# Patient Record
Sex: Female | Born: 1940 | Race: White | Hispanic: No | Marital: Married | Smoking: Never smoker
Health system: Southern US, Community
[De-identification: ages and names within clinical notes are randomized; demographics above are authoritative.]

## PROBLEM LIST (undated history)

## (undated) DIAGNOSIS — I509 Heart failure, unspecified: Secondary | ICD-10-CM

## (undated) DIAGNOSIS — F0390 Unspecified dementia without behavioral disturbance: Secondary | ICD-10-CM

## (undated) DIAGNOSIS — E785 Hyperlipidemia, unspecified: Secondary | ICD-10-CM

## (undated) DIAGNOSIS — I639 Cerebral infarction, unspecified: Secondary | ICD-10-CM

## (undated) DIAGNOSIS — I2510 Atherosclerotic heart disease of native coronary artery without angina pectoris: Secondary | ICD-10-CM

## (undated) DIAGNOSIS — F03C0 Unspecified dementia, severe, without behavioral disturbance, psychotic disturbance, mood disturbance, and anxiety: Secondary | ICD-10-CM

## (undated) DIAGNOSIS — I10 Essential (primary) hypertension: Secondary | ICD-10-CM

---

## 2011-01-27 HISTORY — PX: CARDIAC CATHETERIZATION: SHX172

## 2018-04-23 ENCOUNTER — Emergency Department: Admission: EM | Admit: 2018-04-23 | Payer: Self-pay | Source: Home / Self Care

## 2018-04-23 ENCOUNTER — Other Ambulatory Visit: Payer: Self-pay

## 2018-04-23 ENCOUNTER — Emergency Department
Admission: EM | Admit: 2018-04-23 | Discharge: 2018-04-24 | Disposition: A | Discharge status: Home / Self Care | Payer: Medicare Other | Attending: Emergency Medicine | Admitting: Emergency Medicine

## 2018-04-23 DIAGNOSIS — F0390 Unspecified dementia without behavioral disturbance: Secondary | ICD-10-CM | POA: Insufficient documentation

## 2018-04-23 DIAGNOSIS — Z8673 Personal history of transient ischemic attack (TIA), and cerebral infarction without residual deficits: Secondary | ICD-10-CM | POA: Insufficient documentation

## 2018-04-23 DIAGNOSIS — I110 Hypertensive heart disease with heart failure: Secondary | ICD-10-CM | POA: Diagnosis not present

## 2018-04-23 DIAGNOSIS — Z043 Encounter for examination and observation following other accident: Secondary | ICD-10-CM | POA: Insufficient documentation

## 2018-04-23 DIAGNOSIS — I509 Heart failure, unspecified: Secondary | ICD-10-CM | POA: Insufficient documentation

## 2018-04-23 DIAGNOSIS — W19XXXA Unspecified fall, initial encounter: Secondary | ICD-10-CM | POA: Insufficient documentation

## 2018-04-23 HISTORY — DX: Unspecified dementia without behavioral disturbance: F03.90

## 2018-04-23 HISTORY — DX: Cerebral infarction, unspecified: I63.9

## 2018-04-23 HISTORY — DX: Essential (primary) hypertension: I10

## 2018-04-23 HISTORY — DX: Hyperlipidemia, unspecified: E78.5

## 2018-04-23 HISTORY — DX: Atherosclerotic heart disease of native coronary artery without angina pectoris: I25.10

## 2018-04-23 HISTORY — DX: Heart failure, unspecified: I50.9

## 2018-04-23 HISTORY — DX: Unspecified dementia, severe, without behavioral disturbance, psychotic disturbance, mood disturbance, and anxiety: F03.C0

## 2018-04-23 NOTE — ED Triage Notes (Signed)
Pt arrived via EMS from [facility] after unwitnessed fall. Pt was found on the floor by staff and was sitting in bed per EMS when they arrived. Denies any complaints at this time.  

## 2018-04-23 NOTE — ED Triage Notes (Signed)
[facility] Pt was found on the floor by staff and was sitting in bed per EMS when they arrived. Denies any complaints at this time.

## 2018-04-24 ENCOUNTER — Encounter: Payer: Self-pay | Admitting: Emergency Medicine

## 2018-04-24 NOTE — ED Provider Notes (Signed)
[facility] Emergency Department Provider Note  ____________________________________________   First MD Initiated Contact with Patient 04/23/18 2358     (approximate)  I have reviewed the triage vital signs and the nursing notes.   HISTORY  Chief Complaint Fall   Level 5 caveat:  history/ROS limited by chronic dementia   HPI Tamara Stevenson is a 77 y.o. female with medical history as listed below which notably includes  Advanced dementia who presents for evaluation after an unwitnessed fall at her nursing facility.  She just moved into the nursing facility yesterday.  She was found on the floor but without any complaints.  She reportedly may have gotten back up in the bed by herself.  As per protocol she was sent to the ED for further evaluation.  Her daughter is at bedside and reports that she is at her baseline and they have been having a nice conversation.  The patient has no complaints but is unable to provide any history due to her dementia.  She denies headache, neck pain, chest pain, shortness of breath, and abdominal pain.  Her daughter reports that she is still finishing a course of antibiotics for a urinary tract infection.  Past Medical History:  Diagnosis Date  . 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 CHF (congestive heart failure) (HCC)   . Coronary artery disease   . Hyperlipidemia   . Hypertension   . Stroke [facility])     There are no active problems to display for this patient.   Past Surgical History:  Procedure Laterality Date  . CARDIAC CATHETERIZATION  01/27/2011    Prior to Admission medications   Not on File    Allergies Patient has no known allergies.  History reviewed. No pertinent family history.  Social History Social History   Tobacco Use  . Smoking status: Never Smoker  . Smokeless tobacco: Never Used  Substance Use Topics  . Alcohol use: Never    Frequency: Never  . Drug use: Never    Review of  Systems Level 5 caveat:  history/ROS limited by chronic dementia ____________________________________________   PHYSICAL EXAM:  VITAL SIGNS: ED Triage Vitals  Enc Vitals Group     BP 04/23/18 2328 (!) 160/87     Pulse Rate 04/23/18 2328 62     Resp 04/23/18 2328 16     Temp 04/23/18 2328 (!) 97.5 F (36.4 C)     Temp Source 04/23/18 2328 Oral     SpO2 04/23/18 2328 96 %     Weight 04/23/18 2329 67.9 kg (149 lb 11.1 oz)     Height 04/23/18 2329 1.575 m (5\' 2" )     Head Circumference --      Peak Flow --      Pain Score 04/23/18 2328 0     Pain Loc --      Pain Edu? --      Excl. in GC? --     Constitutional: Awake and alert and tries to answer questions but suffers from advanced Alzheimer's.  She knows her daughter and has been chatting with her and according to the daughter she is at baseline. Eyes: Conjunctivae are normal.  Head: Atraumatic. Nose: No congestion/rhinnorhea. Mouth/Throat: Mucous membranes are moist. Neck: No stridor.  No meningeal signs.  No cervical spine tenderness to palpation. Cardiovascular: Normal rate, regular rhythm. Good peripheral circulation. Grossly normal heart sounds. Respiratory: Normal respiratory effort.  No retractions. Lungs CTAB. Gastrointestinal: Soft and nontender. No  distention.  Musculoskeletal: No lower extremity tenderness nor edema. No gross deformities of extremities.  I fully range the patient's arms and legs and there is no reproducible pain nor tenderness. Neurologic:  Normal speech and language. No gross focal neurologic deficits are appreciated.  Skin:  Skin is warm, dry and intact. No rash noted.   ____________________________________________   LABS (all labs ordered are listed, but only abnormal results are displayed)  Labs Reviewed - No data to display ____________________________________________  EKG  No indication for EKG ____________________________________________  RADIOLOGY   ED MD interpretation: No  indication for imaging  Official radiology report(s): No results found.  ____________________________________________   PROCEDURES  Critical Care performed: No   Procedure(s) performed:   Procedures   ____________________________________________   INITIAL IMPRESSION / ASSESSMENT AND PLAN / ED COURSE  As part of my medical decision making, I reviewed the following data within the electronic MEDICAL RECORD NUMBER History obtained from family, Nursing notes reviewed and incorporated and Old chart reviewed    Differential diagnosis includes, but is not limited to, minor mechanical fall, infection, head injury, ACS.  The patient has no complaints and no evidence of trauma.  She is at her baseline according the daughter.  I had my usual customary eyewitness fall discussion with the daughter and she is comfortable with the plan to not further investigate.  Based on my clinical evaluation I have no concerns that she needs any imaging.  The patient is medically cleared for return back to her facility and the daughter states that she is able to transport her.  I gave my usual and customary return precautions.     ____________________________________________  FINAL CLINICAL IMPRESSION(S) / ED DIAGNOSES  Final diagnoses:  Fall, initial encounter     MEDICATIONS GIVEN DURING THIS VISIT:  Medications - No data to display   ED Discharge Orders    None       Note:  This document was prepared using Dragon voice recognition software and may include unintentional dictation errors.    Loleta RoseForbach, Reni Hausner, MD 04/24/18 [phone]

## 2018-04-24 NOTE — Discharge Instructions (Signed)

## 2018-06-05 ENCOUNTER — Emergency Department: Payer: Medicare Other

## 2018-06-05 ENCOUNTER — Emergency Department
Admission: EM | Admit: 2018-06-05 | Discharge: 2018-06-05 | Disposition: A | Discharge status: Home / Self Care | Payer: Medicare Other | Attending: Emergency Medicine | Admitting: Emergency Medicine

## 2018-06-05 ENCOUNTER — Other Ambulatory Visit: Payer: Self-pay

## 2018-06-05 DIAGNOSIS — Y9389 Activity, other specified: Secondary | ICD-10-CM | POA: Insufficient documentation

## 2018-06-05 DIAGNOSIS — I110 Hypertensive heart disease with heart failure: Secondary | ICD-10-CM | POA: Diagnosis not present

## 2018-06-05 DIAGNOSIS — N390 Urinary tract infection, site not specified: Secondary | ICD-10-CM

## 2018-06-05 DIAGNOSIS — Y92129 Unspecified place in nursing home as the place of occurrence of the external cause: Secondary | ICD-10-CM | POA: Insufficient documentation

## 2018-06-05 DIAGNOSIS — W19XXXA Unspecified fall, initial encounter: Secondary | ICD-10-CM

## 2018-06-05 DIAGNOSIS — I2510 Atherosclerotic heart disease of native coronary artery without angina pectoris: Secondary | ICD-10-CM | POA: Insufficient documentation

## 2018-06-05 DIAGNOSIS — Y998 Other external cause status: Secondary | ICD-10-CM | POA: Diagnosis not present

## 2018-06-05 DIAGNOSIS — F0390 Unspecified dementia without behavioral disturbance: Secondary | ICD-10-CM | POA: Insufficient documentation

## 2018-06-05 DIAGNOSIS — Z8673 Personal history of transient ischemic attack (TIA), and cerebral infarction without residual deficits: Secondary | ICD-10-CM | POA: Diagnosis not present

## 2018-06-05 DIAGNOSIS — W0110XA Fall on same level from slipping, tripping and stumbling with subsequent striking against unspecified object, initial encounter: Secondary | ICD-10-CM | POA: Insufficient documentation

## 2018-06-05 DIAGNOSIS — S0990XA Unspecified injury of head, initial encounter: Secondary | ICD-10-CM

## 2018-06-05 DIAGNOSIS — I509 Heart failure, unspecified: Secondary | ICD-10-CM | POA: Insufficient documentation

## 2018-06-05 LAB — COMPREHENSIVE METABOLIC PANEL
ALT: 22 U/L (ref 0–44)
ANION GAP: 7 (ref 5–15)
AST: 25 U/L (ref 15–41)
Albumin: 4.5 g/dL (ref 3.5–5.0)
Alkaline Phosphatase: 112 U/L (ref 38–126)
BUN: 24 mg/dL — ABNORMAL HIGH (ref 8–23)
CHLORIDE: 105 mmol/L (ref 98–111)
CO2: 28 mmol/L (ref 22–32)
Calcium: 9.2 mg/dL (ref 8.9–10.3)
Creatinine, Ser: 0.91 mg/dL (ref 0.44–1.00)
GFR calc Af Amer: >60 mL/min (ref >60)
GFR calc non Af Amer: >60 mL/min (ref >60)
Glucose, Bld: 133 mg/dL — ABNORMAL HIGH (ref 70–99)
Potassium: 3.8 mmol/L (ref 3.5–5.1)
Sodium: 140 mmol/L (ref 135–145)
Total Bilirubin: 0.7 mg/dL (ref 0.3–1.2)
Total Protein: 8 g/dL (ref 6.5–8.1)

## 2018-06-05 LAB — URINALYSIS, COMPLETE (UACMP) WITH MICROSCOPIC
Bilirubin Urine: NEGATIVE
Glucose, UA: NEGATIVE mg/dL
Hgb urine dipstick: NEGATIVE
Ketones, ur: 5 mg/dL — AB
Nitrite: NEGATIVE
Protein, ur: 30 mg/dL — AB
Specific Gravity, Urine: 1.027 (ref 1.005–1.030)
Squamous Epithelial / HPF: NONE SEEN (ref 0–5)
pH: 5 (ref 5.0–8.0)

## 2018-06-05 LAB — CBC
HCT: 42 % (ref 36.0–46.0)
Hemoglobin: 13.5 g/dL (ref 12.0–15.0)
MCH: 31 pg (ref 26.0–34.0)
MCHC: 32.1 g/dL (ref 30.0–36.0)
MCV: 96.6 fL (ref 80.0–100.0)
PLATELETS: 208 10*3/uL (ref 150–400)
RBC: 4.35 MIL/uL (ref 3.87–5.11)
RDW: 14.1 % (ref 11.5–15.5)
WBC: 5.7 10*3/uL (ref 4.0–10.5)
nRBC: 0 % (ref 0.0–0.2)

## 2018-06-05 LAB — TROPONIN I: Troponin I: <0.03 ng/mL (ref <0.03)

## 2018-06-05 MED ORDER — CEPHALEXIN 500 MG PO CAPS
500.0000 mg | ORAL_CAPSULE | Freq: Once | ORAL | Status: AC
  Administered 2018-06-05: 500 mg via ORAL
  Filled 2018-06-05: qty 1

## 2018-06-05 MED ORDER — CEPHALEXIN 500 MG PO CAPS: 500.0000 mg | ORAL_CAPSULE | Freq: Two times a day (BID) | ORAL | 0 refills | Status: DC

## 2018-06-05 NOTE — ED Notes (Signed)
Report called to [facility], cna at [facility]. Pt was discharged with daughter back to facility.

## 2018-06-05 NOTE — ED Notes (Signed)
Pt cleansed of stool. Urine sample sent to lab.

## 2018-06-05 NOTE — ED Provider Notes (Signed)
[facility] Emergency Department Provider Note   ____________________________________________    I have reviewed the triage vital signs and the nursing notes.   HISTORY  Chief Complaint Fall  History limited by dementia   HPI Tamara Stevenson is a 78 y.o. female with a history of advanced dementia who presents from nursing home for unwitnessed fall.  Patient apparently found down with bleeding from the scalp.  No further history available.  No reports of blood thinners  Past Medical History:  Diagnosis Date  . 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 CHF (congestive heart failure) (HCC)   . Coronary artery disease   . Hyperlipidemia   . Hypertension   . Stroke [facility])     There are no active problems to display for this patient.   Past Surgical History:  Procedure Laterality Date  . CARDIAC CATHETERIZATION  01/27/2011    Prior to Admission medications   Medication Sig Start Date End Date Taking? Authorizing Provider  cephALEXin (KEFLEX) 500 MG capsule Take 1 capsule (500 mg total) by mouth 2 (two) times daily. 06/05/18   Jene Every, MD     Allergies Patient has no known allergies.  No family history on file.  Social History Social History   Tobacco Use  . Smoking status: Never Smoker  . Smokeless tobacco: Never Used  Substance Use Topics  . Alcohol use: Never    Frequency: Never  . Drug use: Never    Level 5 caveat: Unable to obtain review of Systems due to dementia     ____________________________________________   PHYSICAL EXAM:  VITAL SIGNS: ED Triage Vitals  Enc Vitals Group     BP 06/05/18 1941 (!) 154/99     Pulse Rate 06/05/18 1941 69     Resp 06/05/18 1941 16     Temp 06/05/18 1944 (!) 97.4 F (36.3 C)     Temp Source 06/05/18 1941 Oral     SpO2 06/05/18 1941 97 %     Weight 06/05/18 1942 67.5 kg (148 lb 13 oz)     Height 06/05/18 1942 1.626 m (5\' 4" )     Head Circumference --      Peak Flow  --      Pain Score --      Pain Loc --      Pain Edu? --      Excl. in GC? --     Constitutional: Alert, no acute distress Eyes: Conjunctivae are normal.  Left eye is deviated to the left, apparently this is chronic Head: Small pinpoint laceration to the posterior central scalp Nose: No epistaxis or swelling Mouth/Throat: Mucous membranes are moist.   Neck: No vertebral chest palpation Cardiovascular: Normal rate, regular rhythm. Grossly normal heart sounds.  Good peripheral circulation.  No chest wall tenderness to palpation Respiratory: Normal respiratory effort.  No retractions. Gastrointestinal: Soft and nontender. No distention.    Musculoskeletal: No apparent pain with axial load on both hips, full range of motion of all extremities.  No vertebral tenderness to palpation of the spine Neurologic:   No gross focal neurologic deficits are appreciated.  Skin:  Skin is warm, dry and intact. No rash noted. l.  ____________________________________________   LABS (all labs ordered are listed, but only abnormal results are displayed)  Labs Reviewed  COMPREHENSIVE METABOLIC PANEL - Abnormal; Notable for the following components:      Result Value   Glucose, Bld 133 (*)  BUN 24 (*)    All other components within normal limits  URINALYSIS, COMPLETE (UACMP) WITH MICROSCOPIC - Abnormal; Notable for the following components:   Color, Urine YELLOW (*)    APPearance CLOUDY (*)    Ketones, ur 5 (*)    Protein, ur 30 (*)    Leukocytes, UA MODERATE (*)    Bacteria, UA FEW (*)    All other components within normal limits  CBC  TROPONIN I   ____________________________________________  EKG  ED ECG REPORT I, Jene Everyobert Tucker Minter, the attending physician, personally viewed and interpreted this ECG.  Date: 06/05/2018  Rhythm: normal sinus rhythm QRS Axis: normal Intervals: normal ST/T Wave abnormalities: normal Narrative Interpretation: no evidence of acute  ischemia  ____________________________________________  RADIOLOGY  CT head and cervical spine negative for acute injury Straight pelvis negative for fracture ____________________________________________   PROCEDURES  Procedure(s) performed: No  Procedures   Critical Care performed: No ____________________________________________   INITIAL IMPRESSION / ASSESSMENT AND PLAN / ED COURSE  Pertinent labs & imaging results that were available during my care of the patient were reviewed by me and considered in my medical decision making (see chart for details).  Patient with a history of advanced dementia presents after unwitnessed fall.  She has a small laceration to the scalp as noted above, does not appear that it will require any repair.  Pending CT imaging, x-ray of the pelvis.  Labs pending as well.  Overall well-appearing reassuring exam  Lab work is reassuring, urinalysis consistent with mild urinary tract infection, will treat this with p.o. Keflex.  Dose given here.  Discussed findings with daughter, no indication for admission, appropriate for discharge at this time    ____________________________________________   FINAL CLINICAL IMPRESSION(S) / ED DIAGNOSES  Final diagnoses:  Fall, initial encounter  Injury of head, initial encounter  Lower urinary tract infectious disease        Note:  This document was prepared using Dragon voice recognition software and may include unintentional dictation errors.   Jene EveryKinner, Lucy Boardman, MD 06/05/18 2118

## 2018-06-05 NOTE — ED Triage Notes (Signed)
Pt from [facility] with unwitnessed fall. Pt with posterior skull pain and laceration x2 to posterior skull. perrl 90mm. Pt moving all extremities, history of dementia, alert to self only per ems.

## 2018-06-09 ENCOUNTER — Other Ambulatory Visit: Payer: Self-pay

## 2018-06-09 ENCOUNTER — Emergency Department
Admission: EM | Admit: 2018-06-09 | Discharge: 2018-06-09 | Disposition: A | Discharge status: Home / Self Care | Payer: Medicare Other | Attending: Emergency Medicine | Admitting: Emergency Medicine

## 2018-06-09 ENCOUNTER — Emergency Department: Payer: Medicare Other

## 2018-06-09 DIAGNOSIS — I509 Heart failure, unspecified: Secondary | ICD-10-CM | POA: Insufficient documentation

## 2018-06-09 DIAGNOSIS — Y92129 Unspecified place in nursing home as the place of occurrence of the external cause: Secondary | ICD-10-CM | POA: Insufficient documentation

## 2018-06-09 DIAGNOSIS — Y999 Unspecified external cause status: Secondary | ICD-10-CM | POA: Insufficient documentation

## 2018-06-09 DIAGNOSIS — F0390 Unspecified dementia without behavioral disturbance: Secondary | ICD-10-CM | POA: Insufficient documentation

## 2018-06-09 DIAGNOSIS — Y92009 Unspecified place in unspecified non-institutional (private) residence as the place of occurrence of the external cause: Secondary | ICD-10-CM

## 2018-06-09 DIAGNOSIS — W19XXXA Unspecified fall, initial encounter: Secondary | ICD-10-CM | POA: Diagnosis not present

## 2018-06-09 DIAGNOSIS — Y939 Activity, unspecified: Secondary | ICD-10-CM | POA: Insufficient documentation

## 2018-06-09 DIAGNOSIS — S0990XA Unspecified injury of head, initial encounter: Secondary | ICD-10-CM | POA: Diagnosis present

## 2018-06-09 DIAGNOSIS — I2510 Atherosclerotic heart disease of native coronary artery without angina pectoris: Secondary | ICD-10-CM | POA: Diagnosis not present

## 2018-06-09 DIAGNOSIS — I110 Hypertensive heart disease with heart failure: Secondary | ICD-10-CM | POA: Insufficient documentation

## 2018-06-09 DIAGNOSIS — S0093XA Contusion of unspecified part of head, initial encounter: Secondary | ICD-10-CM | POA: Diagnosis not present

## 2018-06-09 NOTE — ED Triage Notes (Addendum)
Pt arrives to ED via [facility] from [facility] with c/o unwitnessed fall with head/facial injury. Per EMS, pt has a small hematoma to the back of her head, a laceration to the bridge of her nose, and a small abrasion to the left cheek. Unsure if pt experienced any LOC, pt c/o pain "all over"; no obvious deformities or dislocations noted. Pt does have a h/x of dementia, anxiety, and depression. Pt is at baseline, per EMS.

## 2018-06-09 NOTE — Discharge Instructions (Signed)
Results for orders placed or performed during the hospital encounter of 06/05/18  CBC  Result Value Ref Range   WBC 5.7 4.0 - 10.5 K/uL   RBC 4.35 3.87 - 5.11 MIL/uL   Hemoglobin 13.5 12.0 - 15.0 g/dL   HCT 82.942.0 56.236.0 - 13.046.0 %   MCV 96.6 80.0 - 100.0 fL   MCH 31.0 26.0 - 34.0 pg   MCHC 32.1 30.0 - 36.0 g/dL   RDW 86.514.1 78.411.5 - 69.615.5 %   Platelets 208 150 - 400 K/uL   nRBC 0.0 0.0 - 0.2 %  Comprehensive metabolic panel  Result Value Ref Range   Sodium 140 135 - 145 mmol/L   Potassium 3.8 3.5 - 5.1 mmol/L   Chloride 105 98 - 111 mmol/L   CO2 28 22 - 32 mmol/L   Glucose, Bld 133 (H) 70 - 99 mg/dL   BUN 24 (H) 8 - 23 mg/dL   Creatinine, Ser 2.950.91 0.44 - 1.00 mg/dL   Calcium 9.2 8.9 - 28.410.3 mg/dL   Total Protein 8.0 6.5 - 8.1 g/dL   Albumin 4.5 3.5 - 5.0 g/dL   AST 25 15 - 41 U/L   ALT 22 0 - 44 U/L   Alkaline Phosphatase 112 38 - 126 U/L   Total Bilirubin 0.7 0.3 - 1.2 mg/dL   GFR calc non Af Amer >60 >60 mL/min   GFR calc Af Amer >60 >60 mL/min   Anion gap 7 5 - 15  Troponin I - ONCE - STAT  Result Value Ref Range   Troponin I <0.03 <0.03 ng/mL  Urinalysis, Complete w Microscopic  Result Value Ref Range   Color, Urine YELLOW (A) YELLOW   APPearance CLOUDY (A) CLEAR   Specific Gravity, Urine 1.027 1.005 - 1.030   pH 5.0 5.0 - 8.0   Glucose, UA NEGATIVE NEGATIVE mg/dL   Hgb urine dipstick NEGATIVE NEGATIVE   Bilirubin Urine NEGATIVE NEGATIVE   Ketones, ur 5 (A) NEGATIVE mg/dL   Protein, ur 30 (A) NEGATIVE mg/dL   Nitrite NEGATIVE NEGATIVE   Leukocytes, UA MODERATE (A) NEGATIVE   RBC / HPF 0-5 0 - 5 RBC/hpf   WBC, UA 21-50 0 - 5 WBC/hpf   Bacteria, UA FEW (A) NONE SEEN   Squamous Epithelial / LPF NONE SEEN 0 - 5   WBC Clumps PRESENT    Mucus PRESENT    Hyaline Casts, UA PRESENT    Dg Pelvis 1-2 Views  Result Date: 06/05/2018 CLINICAL DATA:  Recent fall with hip pain, initial encounter EXAM: PELVIS - 1-2 VIEW COMPARISON:  None. FINDINGS: Pelvic ring is intact. No acute  fracture or dislocation is noted. No soft tissue abnormality is noted. Mild degenerative changes of the hip joints are and lumbar spine are seen. IMPRESSION: No acute abnormality noted. Electronically Signed   By: Alcide CleverMark  Lukens M.D.   On: 06/05/2018 20:31   Ct Head Wo Contrast  Result Date: 06/09/2018 CLINICAL DATA:  Unwitnessed fall at care facility. Facial/head injury. Diffuse pain. History of dementia, hypertension, hyperlipidemia and stroke. EXAM: CT HEAD WITHOUT CONTRAST CT CERVICAL SPINE WITHOUT CONTRAST TECHNIQUE: Multidetector CT imaging of the head and cervical spine was performed following the standard protocol without intravenous contrast. Multiplanar CT image reconstructions of the cervical spine were also generated. COMPARISON:  CT HEAD and cervical spine June 05, 2018. FINDINGS: CT HEAD FINDINGS BRAIN: No intraparenchymal hemorrhage, mass effect nor midline shift. Moderate parenchymal brain volume loss. Heterogeneous basal ganglia and thalami associated with chronic  small vessel ischemic changes. Old RIGHT basal ganglia versus corona radiata small infarct. Patchy supratentorial white matter hypodensities within normal range for patient's age, though non-specific are most compatible with chronic small vessel ischemic disease. No acute large vascular territory infarcts. No abnormal extra-axial fluid collections. Basal cisterns are patent. VASCULAR: Moderate calcific atherosclerosis of the carotid siphons. SKULL: No skull fracture.  Small residual posterior scalp hematoma. SINUSES/ORBITS: Trace paranasal sinus mucosal thickening. Mastoid air cells are well aerated.The included ocular globes and orbital contents are non-suspicious. Status post bilateral ocular lens implants. OTHER: None. CT CERVICAL SPINE FINDINGS ALIGNMENT: Maintained lordosis. Vertebral bodies in alignment. SKULL BASE AND VERTEBRAE: Cervical vertebral bodies and posterior elements are intact. Multilevel severe disc height loss and  endplate spurring consistent with degenerative discs. C3-4 facet fusion on degenerative basis. Severe LEFT C4-5 facet arthropathy. C1-2 articulation maintained with severe osteoarthrosis. SOFT TISSUES AND SPINAL CANAL: Nonacute. Carotid bifurcation calcification and retropharyngeal course. DISC LEVELS: No significant osseous canal stenosis. Moderate to severe RIGHT C2-3, bilateral C3-4, severe LEFT C4-5 and severe RIGHT C5-6 and C6-7 neural foraminal narrowing. UPPER CHEST: Lung apices are clear. OTHER: None. IMPRESSION: CT HEAD: 1. No acute intracranial process. 2. Stable examination including moderate to severe chronic small vessel ischemic changes. CT CERVICAL SPINE: 1. No acute fracture or malalignment. 2. Stable degenerative change of the spine including multilevel severe neural foraminal narrowing. Electronically Signed   By: Awilda Metroourtnay  Bloomer M.D.   On: 06/09/2018 04:10   Ct Head Wo Contrast  Result Date: 06/05/2018 CLINICAL DATA:  Recent fall with headaches and neck pain, initial encounter EXAM: CT HEAD WITHOUT CONTRAST CT CERVICAL SPINE WITHOUT CONTRAST TECHNIQUE: Multidetector CT imaging of the head and cervical spine was performed following the standard protocol without intravenous contrast. Multiplanar CT image reconstructions of the cervical spine were also generated. COMPARISON:  None FINDINGS: CT HEAD FINDINGS Brain: Chronic atrophic and ischemic changes are identified. No findings to suggest acute hemorrhage, acute infarction or space-occupying mass lesion are noted. Vascular: No hyperdense vessel or unexpected calcification. Skull: Normal. Negative for fracture or focal lesion. Sinuses/Orbits: No acute finding. Other: Scalp swelling and laceration is seen near the vertex posteriorly just to the left of the midline. CT CERVICAL SPINE FINDINGS Alignment: Within normal limits. Skull base and vertebrae: 7 cervical segments are well visualized. Vertebral body height is well maintained. Multilevel  facet hypertrophic changes are identified. No acute fracture or acute facet abnormality is noted. Fusion of the posterior elements at C3-4 on the left is noted. Multilevel disc space narrowing and osteophytic changes are seen. The odontoid is within normal limits. Soft tissues and spinal canal: Surrounding soft tissues are within normal limits. Upper chest: Visualized upper chest is within normal limits. Other: None IMPRESSION: CT of the head: Chronic atrophic and ischemic changes without acute intracranial abnormality. Scalp laceration and swelling near the vertex as described. CT of the cervical spine: Multilevel degenerative change without acute abnormality. Electronically Signed   By: Alcide CleverMark  Lukens M.D.   On: 06/05/2018 20:21   Ct Cervical Spine Wo Contrast  Result Date: 06/09/2018 CLINICAL DATA:  Unwitnessed fall at care facility. Facial/head injury. Diffuse pain. History of dementia, hypertension, hyperlipidemia and stroke. EXAM: CT HEAD WITHOUT CONTRAST CT CERVICAL SPINE WITHOUT CONTRAST TECHNIQUE: Multidetector CT imaging of the head and cervical spine was performed following the standard protocol without intravenous contrast. Multiplanar CT image reconstructions of the cervical spine were also generated. COMPARISON:  CT HEAD and cervical spine June 05, 2018. FINDINGS: CT HEAD  FINDINGS BRAIN: No intraparenchymal hemorrhage, mass effect nor midline shift. Moderate parenchymal brain volume loss. Heterogeneous basal ganglia and thalami associated with chronic small vessel ischemic changes. Old RIGHT basal ganglia versus corona radiata small infarct. Patchy supratentorial white matter hypodensities within normal range for patient's age, though non-specific are most compatible with chronic small vessel ischemic disease. No acute large vascular territory infarcts. No abnormal extra-axial fluid collections. Basal cisterns are patent. VASCULAR: Moderate calcific atherosclerosis of the carotid siphons. SKULL:  No skull fracture.  Small residual posterior scalp hematoma. SINUSES/ORBITS: Trace paranasal sinus mucosal thickening. Mastoid air cells are well aerated.The included ocular globes and orbital contents are non-suspicious. Status post bilateral ocular lens implants. OTHER: None. CT CERVICAL SPINE FINDINGS ALIGNMENT: Maintained lordosis. Vertebral bodies in alignment. SKULL BASE AND VERTEBRAE: Cervical vertebral bodies and posterior elements are intact. Multilevel severe disc height loss and endplate spurring consistent with degenerative discs. C3-4 facet fusion on degenerative basis. Severe LEFT C4-5 facet arthropathy. C1-2 articulation maintained with severe osteoarthrosis. SOFT TISSUES AND SPINAL CANAL: Nonacute. Carotid bifurcation calcification and retropharyngeal course. DISC LEVELS: No significant osseous canal stenosis. Moderate to severe RIGHT C2-3, bilateral C3-4, severe LEFT C4-5 and severe RIGHT C5-6 and C6-7 neural foraminal narrowing. UPPER CHEST: Lung apices are clear. OTHER: None. IMPRESSION: CT HEAD: 1. No acute intracranial process. 2. Stable examination including moderate to severe chronic small vessel ischemic changes. CT CERVICAL SPINE: 1. No acute fracture or malalignment. 2. Stable degenerative change of the spine including multilevel severe neural foraminal narrowing. Electronically Signed   By: Awilda Metro M.D.   On: 06/09/2018 04:10   Ct Cervical Spine Wo Contrast  Result Date: 06/05/2018 CLINICAL DATA:  Recent fall with headaches and neck pain, initial encounter EXAM: CT HEAD WITHOUT CONTRAST CT CERVICAL SPINE WITHOUT CONTRAST TECHNIQUE: Multidetector CT imaging of the head and cervical spine was performed following the standard protocol without intravenous contrast. Multiplanar CT image reconstructions of the cervical spine were also generated. COMPARISON:  None FINDINGS: CT HEAD FINDINGS Brain: Chronic atrophic and ischemic changes are identified. No findings to suggest acute  hemorrhage, acute infarction or space-occupying mass lesion are noted. Vascular: No hyperdense vessel or unexpected calcification. Skull: Normal. Negative for fracture or focal lesion. Sinuses/Orbits: No acute finding. Other: Scalp swelling and laceration is seen near the vertex posteriorly just to the left of the midline. CT CERVICAL SPINE FINDINGS Alignment: Within normal limits. Skull base and vertebrae: 7 cervical segments are well visualized. Vertebral body height is well maintained. Multilevel facet hypertrophic changes are identified. No acute fracture or acute facet abnormality is noted. Fusion of the posterior elements at C3-4 on the left is noted. Multilevel disc space narrowing and osteophytic changes are seen. The odontoid is within normal limits. Soft tissues and spinal canal: Surrounding soft tissues are within normal limits. Upper chest: Visualized upper chest is within normal limits. Other: None IMPRESSION: CT of the head: Chronic atrophic and ischemic changes without acute intracranial abnormality. Scalp laceration and swelling near the vertex as described. CT of the cervical spine: Multilevel degenerative change without acute abnormality. Electronically Signed   By: Alcide Clever M.D.   On: 06/05/2018 20:21

## 2018-06-09 NOTE — ED Notes (Signed)
After cleaning a small amount of dried blood off pt's nose, it was discovered that the injury was a not an actual laceration (as charted in the Triage notes), but a minor abrasion.

## 2018-06-09 NOTE — ED Notes (Signed)
Pt returned to ED Rm 10 from CT at this time. 

## 2018-06-09 NOTE — ED Notes (Signed)
Unable to obtain e-signature at d/c d/t pt's h/x of dementia.

## 2018-06-09 NOTE — ED Provider Notes (Signed)
[facility] Emergency Department Provider Note  ____________________________________________   First MD Initiated Contact with Patient 06/09/18 [phone]     (approximate)  I have reviewed the triage vital signs and the nursing notes.   HISTORY  Chief Complaint Fall  Level 5 exemption history limited by the patient's dementia  HPI Tamara Stevenson is a 78 y.o. female who comes to the emergency department via EMS after an unwitnessed fall and facial injury.  She has severe dementia and resides at a nursing home.  She was found on the ground bleeding from her nose in the back of her head.  She was seen in our emergency department 4 days ago for a similar event and had lab work which was unremarkable aside from a urinary tract infection.  She is currently taking antibiotics.  The patient herself has no complaints.  Her tetanus is up-to-date.    Past Medical History:  Diagnosis Date  . 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 CHF (congestive heart failure) (HCC)   . Coronary artery disease   . Hyperlipidemia   . Hypertension   . Stroke [facility](HCC)     There are no active problems to display for this patient.   Past Surgical History:  Procedure Laterality Date  . CARDIAC CATHETERIZATION  01/27/2011    Prior to Admission medications   Medication Sig Start Date End Date Taking? Authorizing Provider  cephALEXin (KEFLEX) 500 MG capsule Take 1 capsule (500 mg total) by mouth 2 (two) times daily. 06/05/18   Jene EveryKinner, Robert, MD    Allergies Patient has no known allergies.  No family history on file.  Social History Social History   Tobacco Use  . Smoking status: Never Smoker  . Smokeless tobacco: Never Used  Substance Use Topics  . Alcohol use: Never    Frequency: Never  . Drug use: Never    Review of Systems Level 5 exemption history limited by the patient's severe dementia ____________________________________________   PHYSICAL EXAM:  VITAL  SIGNS: ED Triage Vitals  Enc Vitals Group     BP      Pulse      Resp      Temp      Temp src      SpO2      Weight      Height      Head Circumference      Peak Flow      Pain Score      Pain Loc      Pain Edu?      Excl. in GC?     Constitutional: Pleasant and cooperative.  Profound dementia.  No respiratory distress Eyes: PERRL EOMI. midrange and brisk Head: Abrasion to the bridge of the nose no laceration.  Slight hematoma to right occipital. Nose: No congestion/rhinnorhea. Mouth/Throat: No trismus Neck: No stridor.  No midline tenderness or step-offs Cardiovascular: Normal rate, regular rhythm. Grossly normal heart sounds.  Good peripheral circulation. Respiratory: Normal respiratory effort.  No retractions. Lungs CTAB and moving good air Gastrointestinal: Soft nontender Musculoskeletal: No lower extremity edema   Neurologic:   No gross focal neurologic deficits are appreciated. Skin: Abrasion as above Psychiatric: Profound dementia    ____________________________________________   DIFFERENTIAL includes but not limited to  Mechanical fall, syncope, cardiogenic syncope, dehydration, intracerebral hemorrhage, cervical spine fracture ____________________________________________   LABS (all labs ordered are listed, but only abnormal results are displayed)  Labs Reviewed - No data to display  Lab work from 4 days ago reviewed by me shows urinary tract infection and no electrolyte disturbances __________________________________________  EKG   ____________________________________________  RADIOLOGY  CT scan of the head neck reviewed by me with no acute disease ____________________________________________   PROCEDURES  Procedure(s) performed: no  Procedures  Critical Care performed: no  ____________________________________________   INITIAL IMPRESSION / ASSESSMENT AND PLAN / ED COURSE  Pertinent labs & imaging results that were available during  my care of the patient were reviewed by me and considered in my medical decision making (see chart for details).   As part of my medical decision making, I reviewed the following data within the electronic MEDICAL RECORD NUMBER History obtained from family if available, nursing notes, old chart and ekg, as well as notes from prior ED visits.  The patient comes to the emergency department with obvious facial trauma after a fall.  She is currently being treated for urinary tract infection and had normal labs 4 days ago after a fall at that point.  Tetanus was updated.  She is neurovascularly intact and her wounds do not require repair.  Head and neck CTs obtained today which fortunately are negative.  Will discharge home to her nursing home with follow-up as scheduled.  Strict return precautions have been given.      ____________________________________________   FINAL CLINICAL IMPRESSION(S) / ED DIAGNOSES  Final diagnoses:  Fall in home, initial encounter  Contusion of head, unspecified part of head, initial encounter      NEW MEDICATIONS STARTED DURING THIS VISIT:  Discharge Medication List as of 06/09/2018  4:21 AM       Note:  This document was prepared using Dragon voice recognition software and may include unintentional dictation errors.    Merrily Brittle, MD 06/11/18 [phone]

## 2018-07-01 ENCOUNTER — Emergency Department
Admission: EM | Admit: 2018-07-01 | Discharge: 2018-07-01 | Disposition: A | Discharge status: Skilled Nursing Facility | Payer: Medicare Other | Attending: Emergency Medicine | Admitting: Emergency Medicine

## 2018-07-01 ENCOUNTER — Encounter: Payer: Self-pay | Admitting: Emergency Medicine

## 2018-07-01 ENCOUNTER — Other Ambulatory Visit: Payer: Self-pay

## 2018-07-01 ENCOUNTER — Emergency Department: Payer: Medicare Other

## 2018-07-01 DIAGNOSIS — Y999 Unspecified external cause status: Secondary | ICD-10-CM | POA: Insufficient documentation

## 2018-07-01 DIAGNOSIS — S0990XA Unspecified injury of head, initial encounter: Secondary | ICD-10-CM | POA: Diagnosis present

## 2018-07-01 DIAGNOSIS — W19XXXA Unspecified fall, initial encounter: Secondary | ICD-10-CM | POA: Insufficient documentation

## 2018-07-01 DIAGNOSIS — I509 Heart failure, unspecified: Secondary | ICD-10-CM | POA: Diagnosis not present

## 2018-07-01 DIAGNOSIS — R51 Headache: Secondary | ICD-10-CM | POA: Diagnosis not present

## 2018-07-01 DIAGNOSIS — I259 Chronic ischemic heart disease, unspecified: Secondary | ICD-10-CM | POA: Insufficient documentation

## 2018-07-01 DIAGNOSIS — I110 Hypertensive heart disease with heart failure: Secondary | ICD-10-CM | POA: Diagnosis not present

## 2018-07-01 DIAGNOSIS — Y92122 Bedroom in nursing home as the place of occurrence of the external cause: Secondary | ICD-10-CM | POA: Diagnosis not present

## 2018-07-01 DIAGNOSIS — F0390 Unspecified dementia without behavioral disturbance: Secondary | ICD-10-CM | POA: Diagnosis not present

## 2018-07-01 DIAGNOSIS — Y9389 Activity, other specified: Secondary | ICD-10-CM | POA: Diagnosis not present

## 2018-07-01 NOTE — ED Notes (Signed)
Assumed care of patient. Patient poor historian. Vss, call light within reach, bed low position. Awaiting transfer back to NH.

## 2018-07-01 NOTE — ED Notes (Signed)
[facility] called spoke to [place] the [employer] , whom stated patient should be transported back via ems. Med tech aware of patients discharge and will be returning back to facility. [facility] [phone] )

## 2018-07-01 NOTE — ED Triage Notes (Addendum)
Pt presents to ED via [facility] from [facility] memory care unit. Pt was found on floor by nursing home staff. EMS report pt had gymnasium fall mat next to bed with all of her pillows and bedding on mat and wet spot on bed. Pt has hx dementia, confused at baseline, does not answer questions during triage. No injuries noted at this time.

## 2018-07-01 NOTE — ED Provider Notes (Signed)
[facility] Emergency Department Provider Note  Time seen: 10:07 AM  I have reviewed the triage vital signs and the nursing notes.   HISTORY  Chief Complaint Fall    HPI Donnel SaxonJoyce N Mcnease is a 78 y.o. female with a past medical history of dementia, CHF, CAD, hypertension, hyperlipidemia, CVA, presents to the emergency department after an unwitnessed fall/found on the floor.  According to EMS report [facility], was found on the floor both of her bedding on the floor and a wet/urine spot on the mattress.  Is not clear if the patient took her bedding off and placed on the floor to sleep, or if the patient fell on the ground.  Here patient is awake, alert, her only complaint she is is is a headache.  Patient has dementia and cannot contribute to her history does not recall falling, cannot answer review of systems questioning.   Past Medical History:  Diagnosis Date  . 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 CHF (congestive heart failure) (HCC)   . Coronary artery disease   . Hyperlipidemia   . Hypertension   . Stroke [facility](HCC)     There are no active problems to display for this patient.   Past Surgical History:  Procedure Laterality Date  . CARDIAC CATHETERIZATION  01/27/2011    Prior to Admission medications   Medication Sig Start Date End Date Taking? Authorizing Provider  cephALEXin (KEFLEX) 500 MG capsule Take 1 capsule (500 mg total) by mouth 2 (two) times daily. 06/05/18   Jene EveryKinner, Robert, MD    No Known Allergies  No family history on file.  Social History Social History   Tobacco Use  . Smoking status: Never Smoker  . Smokeless tobacco: Never Used  Substance Use Topics  . Alcohol use: Never    Frequency: Never  . Drug use: Never    Review of Systems Unable to complete an adequate/accurate review of systems secondary to dementia. __________________________________________   PHYSICAL  EXAM:  VITAL SIGNS: ED Triage Vitals  Enc Vitals Group     BP 07/01/18 0953 (!) 172/71     Pulse Rate 07/01/18 0953 (!) 58     Resp 07/01/18 0953 20     Temp 07/01/18 0953 97.8 F (36.6 C)     Temp Source 07/01/18 0953 Oral     SpO2 07/01/18 0953 97 %     Weight 07/01/18 1006 145 lb (65.8 kg)     Height 07/01/18 1006 5\' 4"  (1.626 m)     Head Circumference --      Peak Flow --      Pain Score --      Pain Loc --      Pain Edu? --      Excl. in GC? --    Constitutional: Alert, no distress. Eyes: Normal exam ENT   Head: Normocephalic and atraumatic.   Mouth/Throat: Mucous membranes are moist. Cardiovascular: Normal rate, regular rhythm. No murmur Respiratory: Normal respiratory effort without tachypnea nor retractions. Breath sounds are clear Gastrointestinal: Soft and nontender. No distention Musculoskeletal: Moves all extremities well, no pain elicited.  No C-spine tenderness. Neurologic:  Normal speech and language. No gross focal neurologic deficits  Skin:  Skin is warm, dry and intact.  Psychiatric: Mood and affect are normal.  ____________________________________________    RADIOLOGY  CT scan of the head is negative  ____________________________________________   INITIAL IMPRESSION / ASSESSMENT AND PLAN / ED  COURSE  Pertinent labs & imaging results that were available during my care of the patient were reviewed by me and considered in my medical decision making (see chart for details).  Patient presents to the emergency department for being found on the floor with all of her bedding.  Is not clear if the patient placed her bedding on the floor to go back to sleep or if the patient fell off of her bed.  Patient has dementia and cannot contribute to history.  Her exam is atraumatic no signs of trauma anywhere on exam.  However the patient is complaining of a mild headache and possible unwitnessed fall we will obtain CT imaging of the head as a precaution.  If  CT is normal anticipate likely discharge back to her nursing facility.  CT scan is negative.  Patient continues to appear well.  We will discharge home.  ____________________________________________   FINAL CLINICAL IMPRESSION(S) / ED DIAGNOSES  Thresa Ross, MD 07/01/18 1108

## 2018-07-06 ENCOUNTER — Other Ambulatory Visit: Payer: Self-pay

## 2018-07-06 ENCOUNTER — Emergency Department
Admission: EM | Admit: 2018-07-06 | Discharge: 2018-07-07 | Disposition: A | Discharge status: Home / Self Care | Payer: Medicare Other | Attending: Emergency Medicine | Admitting: Emergency Medicine

## 2018-07-06 ENCOUNTER — Emergency Department: Payer: Medicare Other

## 2018-07-06 DIAGNOSIS — S0990XA Unspecified injury of head, initial encounter: Secondary | ICD-10-CM | POA: Insufficient documentation

## 2018-07-06 DIAGNOSIS — F0390 Unspecified dementia without behavioral disturbance: Secondary | ICD-10-CM | POA: Diagnosis present

## 2018-07-06 DIAGNOSIS — I2510 Atherosclerotic heart disease of native coronary artery without angina pectoris: Secondary | ICD-10-CM | POA: Diagnosis not present

## 2018-07-06 DIAGNOSIS — I509 Heart failure, unspecified: Secondary | ICD-10-CM | POA: Insufficient documentation

## 2018-07-06 DIAGNOSIS — I110 Hypertensive heart disease with heart failure: Secondary | ICD-10-CM | POA: Diagnosis not present

## 2018-07-06 DIAGNOSIS — Y999 Unspecified external cause status: Secondary | ICD-10-CM | POA: Insufficient documentation

## 2018-07-06 DIAGNOSIS — W19XXXA Unspecified fall, initial encounter: Secondary | ICD-10-CM | POA: Diagnosis not present

## 2018-07-06 DIAGNOSIS — Y929 Unspecified place or not applicable: Secondary | ICD-10-CM | POA: Diagnosis not present

## 2018-07-06 DIAGNOSIS — Y939 Activity, unspecified: Secondary | ICD-10-CM | POA: Insufficient documentation

## 2018-07-06 LAB — GLUCOSE, CAPILLARY: GLUCOSE-CAPILLARY: 117 mg/dL — AB (ref 70–99)

## 2018-07-06 NOTE — ED Provider Notes (Addendum)
[facility] Emergency Department Provider Note  ____________________________________________   I have reviewed the triage vital signs and the nursing notes. Where available I have reviewed prior notes and, if possible and indicated, outside hospital notes.    HISTORY  Chief Complaint Fall and Hyperglycemia    HPI Tamara Stevenson is a 78 y.o. female who presents today complaining of a fall.  EMS reported initially that her blood sugar was 440, but when rechecked it is 117.  There is no known injury, is not clear whether she hit her head or not she cannot remember.  This is not infrequent that she falls.  She has no complaints and states she feels "pretty good"    Past Medical History:  Diagnosis Date  . 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 CHF (congestive heart failure) (HCC)   . Coronary artery disease   . Hyperlipidemia   . Hypertension   . Stroke [facility])     There are no active problems to display for this patient.   Past Surgical History:  Procedure Laterality Date  . CARDIAC CATHETERIZATION  01/27/2011    Prior to Admission medications   Medication Sig Start Date End Date Taking? Authorizing Provider  cephALEXin (KEFLEX) 500 MG capsule Take 1 capsule (500 mg total) by mouth 2 (two) times daily. 06/05/18   Jene Every, MD    Allergies Patient has no known allergies.  No family history on file.  Social History Social History   Tobacco Use  . Smoking status: Never Smoker  . Smokeless tobacco: Never Used  Substance Use Topics  . Alcohol use: Never    Frequency: Never  . Drug use: Never    Review of Systems Constitutional: No fever/chills Eyes: No visual changes. ENT: No sore throat. No stiff neck no neck pain Cardiovascular: Denies chest pain. Respiratory: Denies shortness of breath. Gastrointestinal:   no vomiting.  No diarrhea.  No constipation. Genitourinary: Negative for dysuria. Musculoskeletal: Negative lower  extremity swelling Skin: Negative for rash. Neurological: Negative for severe headaches, focal weakness or numbness.   ____________________________________________   PHYSICAL EXAM:  VITAL SIGNS: ED Triage Vitals  Enc Vitals Group     BP 07/06/18 2208 (!) 147/85     Pulse Rate 07/06/18 2208 67     Resp 07/06/18 2208 16     Temp 07/06/18 2208 98.1 F (36.7 C)     Temp src --      SpO2 07/06/18 2203 96 %     Weight 07/06/18 2209 145 lb 1 oz (65.8 kg)     Height 07/06/18 2209 5\' 4"  (1.626 m)     Head Circumference --      Peak Flow --      Pain Score --      Pain Loc --      Pain Edu? --      Excl. in GC? --     Constitutional: Alert and oriented to name and place smiling and chatting with [place]. Well appearing and in no acute distress. Eyes: Conjunctivae are normal Head: Atraumatic HEENT: No congestion/rhinnorhea. Mucous membranes are moist.  Oropharynx non-erythematous Neck:   Nontender with no meningismus, no masses, no stridor Cardiovascular: Normal rate, regular rhythm. Grossly normal heart sounds.  Good peripheral circulation. Respiratory: Normal respiratory effort.  No retractions. Lungs CTAB. Abdominal: Soft and nontender. No distention. No guarding no rebound Back:  There is no focal tenderness or step off.  there is no midline tenderness  there are no lesions noted. there is no CVA tenderness  Musculoskeletal: No lower extremity tenderness, no upper extremity tenderness. No joint effusions, no DVT signs strong distal pulses no edema Neurologic:  Normal speech and language. No gross focal neurologic deficits are appreciated.  Skin:  Skin is warm, dry and intact. No rash noted. Psychiatric: Mood and affect are normal. Speech and behavior are normal.  ____________________________________________   LABS (all labs ordered are listed, but only abnormal results are displayed)  Labs Reviewed  CBC WITH DIFFERENTIAL/PLATELET  BASIC METABOLIC PANEL  URINALYSIS, COMPLETE  (UACMP) WITH MICROSCOPIC    Pertinent labs  results that were available during my care of the patient were reviewed by me and considered in my medical decision making (see chart for details). ____________________________________________  EKG  I personally interpreted any EKGs ordered by me or triage Sinus rhythm, rate 64, normal axis no acute ST elevation or depression nonspecific ST changes no ischemia ____________________________________________  RADIOLOGY  Pertinent labs & imaging results that were available during my care of the patient were reviewed by me and considered in my medical decision making (see chart for details). If possible, patient and/or family made aware of any abnormal findings.  No results found. ____________________________________________    PROCEDURES  Procedure(s) performed: None  Procedures  Critical Care performed: None  ____________________________________________   INITIAL IMPRESSION / ASSESSMENT AND PLAN / ED COURSE  Pertinent labs & imaging results that were available during my care of the patient were reviewed by me and considered in my medical decision making (see chart for details).  EMS report of a sugar for over 400 I did initiate a work-up with I do not think is necessary at this time we will take a picture of her head as she has no recollection of whether she hit her head or not and is hard to tell what her baseline is, check basic blood work as a precaution, EKG is reassuring, if that is all negative and I think she can go home.  I do not see any evidence of a fracture anywhere  signed out at the end of my shift. Anticipate d.c.   ____________________________________________   FINAL CLINICAL IMPRESSION(S) / ED DIAGNOSES  Final diagnoses:  None      This chart was dictated using voice recognition software.  Despite best efforts to proofread,  errors can occur which can change meaning.      Jeanmarie Plant, MD 07/06/18  2213    Jeanmarie Plant, MD 07/06/18 [phone]

## 2018-07-06 NOTE — ED Triage Notes (Signed)
Pt BIB [facility] from [facility] for an unwitnessed fall. Pt with hx of dementia, is at her baseline according to EMS. Unsure if pt hit her head. On ASA. BG for EMS 414. Pt denies any pain at this time. No obvious injuries.

## 2018-07-06 NOTE — ED Notes (Signed)
Patient returned from Ct, daughter at bedside. Awaiting results and disposition.

## 2018-07-07 DIAGNOSIS — S0990XA Unspecified injury of head, initial encounter: Secondary | ICD-10-CM | POA: Diagnosis not present

## 2018-07-07 LAB — BASIC METABOLIC PANEL
Anion gap: 7 (ref 5–15)
BUN: 33 mg/dL — ABNORMAL HIGH (ref 8–23)
CO2: 27 mmol/L (ref 22–32)
Calcium: 9.1 mg/dL (ref 8.9–10.3)
Chloride: 106 mmol/L (ref 98–111)
Creatinine, Ser: 0.79 mg/dL (ref 0.44–1.00)
GFR calc Af Amer: >60 mL/min (ref >60)
GFR calc non Af Amer: >60 mL/min (ref >60)
Glucose, Bld: 118 mg/dL — ABNORMAL HIGH (ref 70–99)
Potassium: 3.3 mmol/L — ABNORMAL LOW (ref 3.5–5.1)
Sodium: 140 mmol/L (ref 135–145)

## 2018-07-07 LAB — CBC WITH DIFFERENTIAL/PLATELET
Abs Immature Granulocytes: 0.02 10*3/uL (ref 0.00–0.07)
Basophils Absolute: 0 10*3/uL (ref 0.0–0.1)
Basophils Relative: 0 %
Eosinophils Absolute: 0.1 10*3/uL (ref 0.0–0.5)
Eosinophils Relative: 2 %
HCT: 38.8 % (ref 36.0–46.0)
Hemoglobin: 12.5 g/dL (ref 12.0–15.0)
Immature Granulocytes: 0 %
Lymphocytes Relative: 21 %
Lymphs Abs: 1.5 10*3/uL (ref 0.7–4.0)
MCH: 31.3 pg (ref 26.0–34.0)
MCHC: 32.2 g/dL (ref 30.0–36.0)
MCV: 97.2 fL (ref 80.0–100.0)
Monocytes Absolute: 0.6 10*3/uL (ref 0.1–1.0)
Monocytes Relative: 9 %
NEUTROS ABS: 4.9 10*3/uL (ref 1.7–7.7)
Neutrophils Relative %: 68 %
Platelets: 214 10*3/uL (ref 150–400)
RBC: 3.99 MIL/uL (ref 3.87–5.11)
RDW: 14 % (ref 11.5–15.5)
WBC: 7.2 10*3/uL (ref 4.0–10.5)
nRBC: 0 % (ref 0.0–0.2)

## 2018-07-07 LAB — GLUCOSE, CAPILLARY: Glucose-Capillary: 91 mg/dL (ref 70–99)

## 2018-07-07 NOTE — ED Notes (Signed)
Ct head negative, daughter to take back to nursing facility. Discharge papers provided.

## 2018-07-07 NOTE — ED Notes (Signed)
labs sent awaiting result for further plan of care.

## 2018-08-12 ENCOUNTER — Emergency Department
Admission: EM | Admit: 2018-08-12 | Discharge: 2018-08-12 | Disposition: A | Discharge status: Home / Self Care | Payer: Medicare Other | Attending: Emergency Medicine | Admitting: Emergency Medicine

## 2018-08-12 ENCOUNTER — Encounter: Payer: Self-pay | Admitting: Emergency Medicine

## 2018-08-12 ENCOUNTER — Other Ambulatory Visit: Payer: Self-pay

## 2018-08-12 DIAGNOSIS — Z711 Person with feared health complaint in whom no diagnosis is made: Secondary | ICD-10-CM | POA: Insufficient documentation

## 2018-08-12 DIAGNOSIS — Z7982 Long term (current) use of aspirin: Secondary | ICD-10-CM | POA: Insufficient documentation

## 2018-08-12 DIAGNOSIS — I110 Hypertensive heart disease with heart failure: Secondary | ICD-10-CM | POA: Insufficient documentation

## 2018-08-12 DIAGNOSIS — G309 Alzheimer's disease, unspecified: Secondary | ICD-10-CM | POA: Insufficient documentation

## 2018-08-12 DIAGNOSIS — W19XXXA Unspecified fall, initial encounter: Secondary | ICD-10-CM

## 2018-08-12 DIAGNOSIS — I2510 Atherosclerotic heart disease of native coronary artery without angina pectoris: Secondary | ICD-10-CM | POA: Diagnosis not present

## 2018-08-12 DIAGNOSIS — I509 Heart failure, unspecified: Secondary | ICD-10-CM | POA: Diagnosis not present

## 2018-08-12 DIAGNOSIS — Z043 Encounter for examination and observation following other accident: Secondary | ICD-10-CM | POA: Diagnosis present

## 2018-08-12 DIAGNOSIS — Z79899 Other long term (current) drug therapy: Secondary | ICD-10-CM | POA: Insufficient documentation

## 2018-08-12 LAB — CBC
HCT: 39.4 % (ref 36.0–46.0)
Hemoglobin: 12.4 g/dL (ref 12.0–15.0)
MCH: 30.7 pg (ref 26.0–34.0)
MCHC: 31.5 g/dL (ref 30.0–36.0)
MCV: 97.5 fL (ref 80.0–100.0)
Platelets: 142 10*3/uL — ABNORMAL LOW (ref 150–400)
RBC: 4.04 MIL/uL (ref 3.87–5.11)
RDW: 13.7 % (ref 11.5–15.5)
WBC: 5.8 10*3/uL (ref 4.0–10.5)
nRBC: 0 % (ref 0.0–0.2)

## 2018-08-12 LAB — COMPREHENSIVE METABOLIC PANEL
ALBUMIN: 3.8 g/dL (ref 3.5–5.0)
ALT: 24 U/L (ref 0–44)
AST: 27 U/L (ref 15–41)
Alkaline Phosphatase: 95 U/L (ref 38–126)
Anion gap: 6 (ref 5–15)
BUN: 24 mg/dL — ABNORMAL HIGH (ref 8–23)
CO2: 27 mmol/L (ref 22–32)
Calcium: 9.1 mg/dL (ref 8.9–10.3)
Chloride: 107 mmol/L (ref 98–111)
Creatinine, Ser: 0.78 mg/dL (ref 0.44–1.00)
GFR calc Af Amer: >60 mL/min (ref >60)
GFR calc non Af Amer: >60 mL/min (ref >60)
GLUCOSE: 90 mg/dL (ref 70–99)
Potassium: 4.1 mmol/L (ref 3.5–5.1)
SODIUM: 140 mmol/L (ref 135–145)
Total Bilirubin: 0.7 mg/dL (ref 0.3–1.2)
Total Protein: 7.3 g/dL (ref 6.5–8.1)

## 2018-08-12 LAB — TROPONIN I: Troponin I: <0.03 ng/mL (ref <0.03)

## 2018-08-12 NOTE — ED Notes (Signed)
Rockney Ghee, daughter of pt, phone number [phone]

## 2018-08-12 NOTE — ED Notes (Signed)
Patient awake and oriented to baseline. Vitals stable. NAD.

## 2018-08-12 NOTE — ED Notes (Signed)
Discharged to [facility] with Son.

## 2018-08-12 NOTE — ED Notes (Signed)
This RN called [facility] requesting transportation for Mrs. Tamara Stevenson. I was instructed by the receptionist that they do not provide transportation for their residents. Receptionist stated she would reach out to immediate family for a ride and will call once she gets in touch with family.

## 2018-08-12 NOTE — ED Triage Notes (Signed)
Pt presents from [facility] via [facility] with c/o fall. Staff found pt on floor. Staff sent pt to ED due to unwitnessed fall. It is believed that pt rolled off bed, but staff is unsure. Pt has hx of dementia. Staff reports that pt is at baseline neuro status. Pt able to ambulate to bathroom at this time and in appears in NAD.

## 2018-08-12 NOTE — ED Provider Notes (Signed)
[facility] Emergency Department Provider Note    First MD Initiated Contact with Patient 08/12/18 0543     (approximate)  I have reviewed the triage vital signs and the nursing notes.   HISTORY Level 5 caveat history and review of system limited secondary to Alzheimer's dementia.  Chief Complaint Fall   HPI Tamara Stevenson is a 78 y.o. female with history of Alzheimer's dementia CHF CAD hypertension and previous stroke presents to the emergency department via EMS from [facility] secondary to unwitnessed fall.  Per EMS staff found the patient on the floor this morning with stated suspicion that the patient may have rolled out of bed.        Past Medical History:  Diagnosis Date  . 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 CHF (congestive heart failure) (HCC)   . Coronary artery disease   . Hyperlipidemia   . Hypertension   . Stroke [facility])     There are no active problems to display for this patient.   Past Surgical History:  Procedure Laterality Date  . CARDIAC CATHETERIZATION  01/27/2011    Prior to Admission medications   Medication Sig Start Date End Date Taking? Authorizing Provider  acetaminophen (TYLENOL) 325 MG tablet Take 2 tablets by mouth every 6 (six) hours as needed.    [provider]  alendronate (FOSAMAX) 70 MG tablet Take 70 mg by mouth once a week. 07/03/18   [provider]  aspirin EC 81 MG tablet Take 81 mg by mouth daily.    [provider]  atorvastatin (LIPITOR) 40 MG tablet Take 40 mg by mouth every evening. 06/05/18   [provider]  b complex vitamins capsule Take 1 capsule by mouth daily.    [provider]  Cholecalciferol (VITAMIN D3) 50 MCG (2000 UT) capsule Take 2,000 Units by mouth daily.    [provider]  Cinnamon 500 MG capsule Take 500 mg by mouth daily.    [provider]  folic acid (FOLVITE) 800 MCG tablet Take 800  mcg by mouth daily.    [provider]  memantine (NAMENDA) 10 MG tablet Take 10 mg by mouth daily. 07/05/18   [provider]  metoprolol tartrate (LOPRESSOR) 50 MG tablet Take 50 mg by mouth 2 (two) times daily. 07/03/18   [provider]  Multiple Vitamin (MULTI VITAMIN DAILY) TABS Take 1 tablet by mouth daily.    [provider]  rivastigmine (EXELON) 3 MG capsule Take 3 mg by mouth 2 (two) times daily. 07/05/18   [provider]  sertraline (ZOLOFT) 50 MG tablet Take 1.5 tablets by mouth daily. 06/05/18   [provider]    Allergies Patient has no known allergies.  History reviewed. No pertinent family history.  Social History Social History   Tobacco Use  . Smoking status: Never Smoker  . Smokeless tobacco: Never Used  Substance Use Topics  . Alcohol use: Never    Frequency: Never  . Drug use: Never    Review of Systems Constitutional: No fever/chills Eyes: No visual changes. ENT: No sore throat. Cardiovascular: Denies chest pain. Respiratory: Denies shortness of breath. Gastrointestinal: No abdominal pain.  No nausea, no vomiting.  No diarrhea.  No constipation. Genitourinary: Negative for dysuria. Musculoskeletal: Negative for neck pain.  Negative for back pain. Integumentary: Negative for rash. Neurological: Negative for headaches, focal weakness or numbness.  ____________________________________________   PHYSICAL EXAM:  VITAL SIGNS:  ED Triage Vitals  Enc Vitals Group     BP 08/12/18 0544 (!) 177/65     Pulse Rate 08/12/18 0544 62     Resp 08/12/18 0544 11     Temp 08/12/18 0544 98.4 F (36.9 C)     Temp Source 08/12/18 0544 Oral     SpO2 08/12/18 0544 100 %     Weight 08/12/18 0537 65.8 kg (145 lb)     Height 08/12/18 0537 1.651 m (5\' 5" )     Head Circumference --      Peak Flow --      Pain Score --      Pain Loc --      Pain Edu? --      Excl. in GC? --     Constitutional: Alert and  Well  appearing and in no acute distress. Eyes: Conjunctivae are normal. PERRL. EOMI. Head: Atraumatic. Mouth/Throat: Mucous membranes are moist. Oropharynx non-erythematous. Neck: No stridor.   Cardiovascular: Normal rate, regular rhythm. Good peripheral circulation. Grossly normal heart sounds. Respiratory: Normal respiratory effort.  No retractions. Lungs CTAB. Gastrointestinal: Soft and nontender. No distention.  Musculoskeletal: No lower extremity tenderness nor edema. No gross deformities of extremities. Neurologic:  Normal speech and language. No gross focal neurologic deficits are appreciated.  Skin:  Skin is warm, dry and intact. No rash noted. Psychiatric: Mood and affect are normal. Speech and behavior are normal.  ____________________________________________   LABS (all labs ordered are listed, but only abnormal results are displayed)  Labs Reviewed - No data to display ____________________________________________  EKG  ED ECG REPORT I, [place] N Brynlie Daza, the attending physician, personally viewed and interpreted this ECG.   Date: 08/12/2018  EKG Time: 5:43 AM  Rate: 62  Rhythm: Normal sinus rhythm  Axis: Normal  Intervals: Normal  ST&T Change: None    Procedures   ____________________________________________   INITIAL IMPRESSION / MDM / ASSESSMENT AND PLAN / ED COURSE  As part of my medical decision making, I reviewed the following data within the electronic MEDICAL RECORD NUMBER  78 year old female presenting to the emergency department with above-stated history and physical exam without any signs of trauma.  EKG revealed normal sinus rhythm without ectopy.     ____________________________________________  FINAL CLINICAL IMPRESSION(S) / ED DIAGNOSES  Final diagnoses:  Fall, initial encounter     MEDICATIONS GIVEN DURING THIS VISIT:  Medications - No data to display   ED Discharge Orders    None       Note:  This document was prepared using Dragon  voice recognition software and may include unintentional dictation errors.   Darci Current, MD 08/12/18 [id number]

## 2018-08-12 NOTE — ED Notes (Signed)
Attempted to provide breakfast patient refused x3

## 2018-08-27 ENCOUNTER — Emergency Department: Payer: Medicare Other

## 2018-08-27 ENCOUNTER — Encounter: Payer: Self-pay | Admitting: Emergency Medicine

## 2018-08-27 ENCOUNTER — Inpatient Hospital Stay
Admission: EM | Admit: 2018-08-27 | Discharge: 2018-08-30 | DRG: 871 | Disposition: A | Discharge status: Skilled Nursing Facility | Payer: Medicare Other | Attending: Internal Medicine | Admitting: Internal Medicine

## 2018-08-27 ENCOUNTER — Other Ambulatory Visit: Payer: Self-pay

## 2018-08-27 DIAGNOSIS — Z79899 Other long term (current) drug therapy: Secondary | ICD-10-CM

## 2018-08-27 DIAGNOSIS — E876 Hypokalemia: Secondary | ICD-10-CM | POA: Diagnosis present

## 2018-08-27 DIAGNOSIS — J189 Pneumonia, unspecified organism: Secondary | ICD-10-CM | POA: Diagnosis present

## 2018-08-27 DIAGNOSIS — I2510 Atherosclerotic heart disease of native coronary artery without angina pectoris: Secondary | ICD-10-CM | POA: Diagnosis present

## 2018-08-27 DIAGNOSIS — R05 Cough: Secondary | ICD-10-CM | POA: Diagnosis present

## 2018-08-27 DIAGNOSIS — I5032 Chronic diastolic (congestive) heart failure: Secondary | ICD-10-CM | POA: Diagnosis present

## 2018-08-27 DIAGNOSIS — G309 Alzheimer's disease, unspecified: Secondary | ICD-10-CM | POA: Diagnosis present

## 2018-08-27 DIAGNOSIS — K529 Noninfective gastroenteritis and colitis, unspecified: Secondary | ICD-10-CM | POA: Diagnosis present

## 2018-08-27 DIAGNOSIS — R112 Nausea with vomiting, unspecified: Secondary | ICD-10-CM | POA: Diagnosis present

## 2018-08-27 DIAGNOSIS — A419 Sepsis, unspecified organism: Secondary | ICD-10-CM | POA: Diagnosis present

## 2018-08-27 DIAGNOSIS — Z20828 Contact with and (suspected) exposure to other viral communicable diseases: Secondary | ICD-10-CM | POA: Diagnosis present

## 2018-08-27 DIAGNOSIS — I69354 Hemiplegia and hemiparesis following cerebral infarction affecting left non-dominant side: Secondary | ICD-10-CM

## 2018-08-27 DIAGNOSIS — I110 Hypertensive heart disease with heart failure: Secondary | ICD-10-CM | POA: Diagnosis present

## 2018-08-27 DIAGNOSIS — Z7982 Long term (current) use of aspirin: Secondary | ICD-10-CM

## 2018-08-27 DIAGNOSIS — F0280 Dementia in other diseases classified elsewhere without behavioral disturbance: Secondary | ICD-10-CM | POA: Diagnosis present

## 2018-08-27 DIAGNOSIS — Z66 Do not resuscitate: Secondary | ICD-10-CM | POA: Diagnosis present

## 2018-08-27 DIAGNOSIS — G9340 Encephalopathy, unspecified: Secondary | ICD-10-CM | POA: Diagnosis present

## 2018-08-27 DIAGNOSIS — E785 Hyperlipidemia, unspecified: Secondary | ICD-10-CM | POA: Diagnosis present

## 2018-08-27 DIAGNOSIS — J181 Lobar pneumonia, unspecified organism: Secondary | ICD-10-CM

## 2018-08-27 LAB — CBC WITH DIFFERENTIAL/PLATELET
Abs Immature Granulocytes: 0.02 K/uL (ref 0.00–0.07)
Basophils Absolute: 0 K/uL (ref 0.0–0.1)
Basophils Relative: 0 %
Eosinophils Absolute: 0.1 K/uL (ref 0.0–0.5)
Eosinophils Relative: 2 %
HCT: 34.5 % — ABNORMAL LOW (ref 36.0–46.0)
Hemoglobin: 11.1 g/dL — ABNORMAL LOW (ref 12.0–15.0)
Immature Granulocytes: 0 %
Lymphocytes Relative: 9 %
Lymphs Abs: 0.4 K/uL — ABNORMAL LOW (ref 0.7–4.0)
MCH: 31.6 pg (ref 26.0–34.0)
MCHC: 32.2 g/dL (ref 30.0–36.0)
MCV: 98.3 fL (ref 80.0–100.0)
Monocytes Absolute: 0.4 K/uL (ref 0.1–1.0)
Monocytes Relative: 8 %
Neutro Abs: 3.8 K/uL (ref 1.7–7.7)
Neutrophils Relative %: 81 %
Platelets: 162 K/uL (ref 150–400)
RBC: 3.51 MIL/uL — ABNORMAL LOW (ref 3.87–5.11)
RDW: 13.5 % (ref 11.5–15.5)
WBC: 4.7 K/uL (ref 4.0–10.5)
nRBC: 0 % (ref 0.0–0.2)

## 2018-08-27 LAB — COMPREHENSIVE METABOLIC PANEL
ALT: 17 U/L (ref 0–44)
AST: 22 U/L (ref 15–41)
Albumin: 3.5 g/dL (ref 3.5–5.0)
Alkaline Phosphatase: 100 U/L (ref 38–126)
Anion gap: 7 (ref 5–15)
BUN: 25 mg/dL — ABNORMAL HIGH (ref 8–23)
CO2: 27 mmol/L (ref 22–32)
Calcium: 8.3 mg/dL — ABNORMAL LOW (ref 8.9–10.3)
Chloride: 108 mmol/L (ref 98–111)
Creatinine, Ser: 0.68 mg/dL (ref 0.44–1.00)
GFR calc Af Amer: >60 mL/min (ref >60)
GFR calc non Af Amer: >60 mL/min (ref >60)
Glucose, Bld: 90 mg/dL (ref 70–99)
Potassium: 3.3 mmol/L — ABNORMAL LOW (ref 3.5–5.1)
Sodium: 142 mmol/L (ref 135–145)
Total Bilirubin: 0.7 mg/dL (ref 0.3–1.2)
Total Protein: 7 g/dL (ref 6.5–8.1)

## 2018-08-27 LAB — URINALYSIS, COMPLETE (UACMP) WITH MICROSCOPIC
Bilirubin Urine: NEGATIVE
Glucose, UA: NEGATIVE mg/dL
Hgb urine dipstick: NEGATIVE
Ketones, ur: NEGATIVE mg/dL
Nitrite: POSITIVE — AB
Protein, ur: NEGATIVE mg/dL
Specific Gravity, Urine: 1.025 (ref 1.005–1.030)
pH: 6 (ref 5.0–8.0)

## 2018-08-27 LAB — PROCALCITONIN: Procalcitonin: <0.1 ng/mL

## 2018-08-27 LAB — LIPASE, BLOOD: Lipase: 76 U/L — ABNORMAL HIGH (ref 11–51)

## 2018-08-27 LAB — MRSA PCR SCREENING: MRSA by PCR: NEGATIVE

## 2018-08-27 LAB — LACTIC ACID, PLASMA
Lactic Acid, Venous: 0.8 mmol/L (ref 0.5–1.9)
Lactic Acid, Venous: 1 mmol/L (ref 0.5–1.9)

## 2018-08-27 LAB — INFLUENZA PANEL BY PCR (TYPE A & B)
Influenza A By PCR: NEGATIVE
Influenza B By PCR: NEGATIVE

## 2018-08-27 MED ORDER — SODIUM CHLORIDE 0.9 % IV SOLN
2.0000 g | Freq: Once | INTRAVENOUS | Status: DC
  Filled 2018-08-27: qty 2

## 2018-08-27 MED ORDER — ONDANSETRON HCL 4 MG PO TABS: 4.0000 mg | ORAL_TABLET | Freq: Four times a day (QID) | ORAL | Status: DC | PRN

## 2018-08-27 MED ORDER — SODIUM CHLORIDE 0.9 % IV SOLN
1.0000 g | INTRAVENOUS | Status: DC
  Administered 2018-08-27 – 2018-08-30 (×4): 1 g via INTRAVENOUS
  Filled 2018-08-27 (×2): qty 1
  Filled 2018-08-27: qty 10
  Filled 2018-08-27: qty 1
  Filled 2018-08-27: qty 10

## 2018-08-27 MED ORDER — SODIUM CHLORIDE 0.9 % IV SOLN
500.0000 mg | INTRAVENOUS | Status: DC
  Administered 2018-08-28: 500 mg via INTRAVENOUS
  Filled 2018-08-27 (×3): qty 500

## 2018-08-27 MED ORDER — ACETAMINOPHEN 325 MG PO TABS: 650.0000 mg | ORAL_TABLET | Freq: Four times a day (QID) | ORAL | Status: DC | PRN

## 2018-08-27 MED ORDER — VANCOMYCIN HCL 10 G IV SOLR
1500.0000 mg | Freq: Once | INTRAVENOUS | Status: AC
  Administered 2018-08-27: 1500 mg via INTRAVENOUS
  Filled 2018-08-27: qty 1500

## 2018-08-27 MED ORDER — ONDANSETRON HCL 4 MG/2ML IJ SOLN: 4.0000 mg | Freq: Four times a day (QID) | INTRAMUSCULAR | Status: DC | PRN

## 2018-08-27 MED ORDER — POTASSIUM CHLORIDE CRYS ER 20 MEQ PO TBCR
40.0000 meq | EXTENDED_RELEASE_TABLET | Freq: Once | ORAL | Status: AC
  Administered 2018-08-27: 40 meq via ORAL
  Filled 2018-08-27: qty 2

## 2018-08-27 MED ORDER — POLYETHYLENE GLYCOL 3350 17 G PO PACK: 17.0000 g | PACK | Freq: Every day | ORAL | Status: DC | PRN

## 2018-08-27 MED ORDER — ENOXAPARIN SODIUM 40 MG/0.4ML ~~LOC~~ SOLN
40.0000 mg | SUBCUTANEOUS | Status: DC
  Administered 2018-08-27 – 2018-08-29 (×3): 40 mg via SUBCUTANEOUS
  Filled 2018-08-27 (×3): qty 0.4

## 2018-08-27 MED ORDER — ALBUTEROL SULFATE HFA 108 (90 BASE) MCG/ACT IN AERS
2.0000 | INHALATION_SPRAY | Freq: Four times a day (QID) | RESPIRATORY_TRACT | Status: DC | PRN
  Filled 2018-08-27: qty 6.7

## 2018-08-27 MED ORDER — ALBUTEROL SULFATE (2.5 MG/3ML) 0.083% IN NEBU: 2.5000 mg | INHALATION_SOLUTION | RESPIRATORY_TRACT | Status: DC | PRN

## 2018-08-27 MED ORDER — SODIUM CHLORIDE 0.9 % IV SOLN: 2.0000 g | Freq: Once | INTRAVENOUS | Status: DC

## 2018-08-27 MED ORDER — ACETAMINOPHEN 650 MG RE SUPP: 650.0000 mg | Freq: Four times a day (QID) | RECTAL | Status: DC | PRN

## 2018-08-27 MED ORDER — ACETAMINOPHEN 500 MG PO TABS
1000.0000 mg | ORAL_TABLET | Freq: Once | ORAL | Status: AC
  Administered 2018-08-27: 1000 mg via ORAL
  Filled 2018-08-27: qty 2

## 2018-08-27 NOTE — Consult Note (Signed)
CODE SEPSIS - PHARMACY COMMUNICATION  **Broad Spectrum Antibiotics should be administered within 1 hour of Sepsis diagnosis**  Time Code Sepsis Called/Page Received: 1012  Antibiotics Ordered: Ceftriaxone/Azithromycin  Time of 1st antibiotic administration: 1156  Additional action taken by pharmacy: Spoke to Victorino Dike - will follow with Dr Roxan Hockey (2nd delay - change in abx)  If necessary, Name of Provider/Nurse Contacted: Ananias Pilgrim, PharmD, BCPS Clinical Pharmacist 08/27/2018 10:12 AM

## 2018-08-27 NOTE — ED Provider Notes (Signed)
[facility] Emergency Department Provider Note    First MD Initiated Contact with Patient 08/27/18 [phone]     (approximate)  I have reviewed the triage vital signs and the nursing notes.   HISTORY  Chief Complaint Nausea; Emesis; and Diarrhea  Level V Caveat:  dementia  HPI Tamara Stevenson is a 78 y.o. female below listed past medical history presents the ER for evaluation of nausea vomiting diarrhea.  Patient unable to provide much additional history given her history of advanced dementia.  Currently living in a memory care facility.  No recent antibiotics reported.  Symptoms started early in the morning.    Past Medical History:  Diagnosis Date  . 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 CHF (congestive heart failure) (HCC)   . Coronary artery disease   . Hyperlipidemia   . Hypertension   . Stroke [facility])    History reviewed. No pertinent family history. Past Surgical History:  Procedure Laterality Date  . CARDIAC CATHETERIZATION  01/27/2011   Patient Active Problem List   Diagnosis Date Noted  . Sepsis (HCC) 08/27/2018      Prior to Admission medications   Medication Sig Start Date End Date Taking? Authorizing Provider  alendronate (FOSAMAX) 70 MG tablet Take 70 mg by mouth every Sunday.  07/03/18  Yes [provider]  Ascorbic Acid (VITAMIN C) 1000 MG tablet Take 1,000 mg by mouth daily.   Yes [provider]  aspirin EC 81 MG tablet Take 81 mg by mouth daily.   Yes [provider]  atorvastatin (LIPITOR) 40 MG tablet Take 40 mg by mouth daily.  06/05/18  Yes [provider]  Cholecalciferol (VITAMIN D3) 50 MCG (2000 UT) capsule Take 2,000 Units by mouth daily.   Yes [provider]  memantine (NAMENDA) 10 MG tablet Take 10 mg by mouth 2 (two) times daily.  07/05/18  Yes [provider]  metoprolol tartrate (LOPRESSOR) 50 MG tablet Take 50 mg by mouth 2 (two) times daily. 07/03/18  Yes  [provider]  Multiple Vitamin (MULTI VITAMIN DAILY) TABS Take 1 tablet by mouth daily.   Yes [provider]  rivastigmine (EXELON) 3 MG capsule Take 3 mg by mouth 2 (two) times daily. 07/05/18  Yes [provider]  sertraline (ZOLOFT) 50 MG tablet Take 75 mg by mouth daily.  06/05/18  Yes [provider]  traZODone (DESYREL) 50 MG tablet Take 50 mg by mouth at bedtime.   Yes [provider]    Allergies Patient has no known allergies.    Social History Social History   Tobacco Use  . Smoking status: Never Smoker  . Smokeless tobacco: Never Used  Substance Use Topics  . Alcohol use: Never    Frequency: Never  . Drug use: Never    Review of Systems Patient denies headaches, rhinorrhea, blurry vision, numbness, shortness of breath, chest pain, edema, cough, abdominal pain, nausea, vomiting, diarrhea, dysuria, fevers, rashes or hallucinations unless otherwise stated above in HPI. ____________________________________________   PHYSICAL EXAM:  VITAL SIGNS: Vitals:   08/27/18 1151 08/27/18 1253  BP: 135/68 (!) 118/55  Pulse: 79 75  Resp: 16 19  Temp:  99.3 F (37.4 C)  SpO2: 100% 96%    Constitutional: Alert drowsy and ill appearing Eyes: Conjunctivae are normal.  Head: Atraumatic. Nose: No congestion/rhinnorhea. Mouth/Throat: Mucous membranes are moist.   Neck: No stridor. Painless ROM.  Cardiovascular: Normal rate, regular rhythm. Grossly normal  heart sounds.  Good peripheral circulation. Respiratory: Normal respiratory effort.  No retractions. Lungs CTAB. Gastrointestinal: Soft with mild ttp in llq.   No distention. No abdominal bruits. No CVA tenderness. Genitourinary: normal external genitalia Musculoskeletal: No lower extremity tenderness nor edema.  No joint effusions. Neurologic:   No gross focal neurologic deficits are appreciated. No facial droop Skin:  Skin is warm, dry and intact. No rash noted. Psychiatric:  Mood and affect are normal. Speech and behavior are normal.  ____________________________________________   LABS (all labs ordered are listed, but only abnormal results are displayed)  Results for orders placed or performed during the hospital encounter of 08/27/18 (from the past 24 hour(s))  CBC with Differential/Platelet     Status: Abnormal   Collection Time: 08/27/18 10:08 AM  Result Value Ref Range   WBC 4.7 4.0 - 10.5 K/uL   RBC 3.51 (L) 3.87 - 5.11 MIL/uL   Hemoglobin 11.1 (L) 12.0 - 15.0 g/dL   HCT 67.2 (L) 09.4 - 70.9 %   MCV 98.3 80.0 - 100.0 fL   MCH 31.6 26.0 - 34.0 pg   MCHC 32.2 30.0 - 36.0 g/dL   RDW 62.8 36.6 - 29.4 %   Platelets 162 150 - 400 K/uL   nRBC 0.0 0.0 - 0.2 %   Neutrophils Relative % 81 %   Neutro Abs 3.8 1.7 - 7.7 K/uL   Lymphocytes Relative 9 %   Lymphs Abs 0.4 (L) 0.7 - 4.0 K/uL   Monocytes Relative 8 %   Monocytes Absolute 0.4 0.1 - 1.0 K/uL   Eosinophils Relative 2 %   Eosinophils Absolute 0.1 0.0 - 0.5 K/uL   Basophils Relative 0 %   Basophils Absolute 0.0 0.0 - 0.1 K/uL   Immature Granulocytes 0 %   Abs Immature Granulocytes 0.02 0.00 - 0.07 K/uL  Comprehensive metabolic panel     Status: Abnormal   Collection Time: 08/27/18 10:08 AM  Result Value Ref Range   Sodium 142 135 - 145 mmol/L   Potassium 3.3 (L) 3.5 - 5.1 mmol/L   Chloride 108 98 - 111 mmol/L   CO2 27 22 - 32 mmol/L   Glucose, Bld 90 70 - 99 mg/dL   BUN 25 (H) 8 - 23 mg/dL   Creatinine, Ser 7.65 0.44 - 1.00 mg/dL   Calcium 8.3 (L) 8.9 - 10.3 mg/dL   Total Protein 7.0 6.5 - 8.1 g/dL   Albumin 3.5 3.5 - 5.0 g/dL   AST 22 15 - 41 U/L   ALT 17 0 - 44 U/L   Alkaline Phosphatase 100 38 - 126 U/L   Total Bilirubin 0.7 0.3 - 1.2 mg/dL   GFR calc non Af Amer >60 >60 mL/min   GFR calc Af Amer >60 >60 mL/min   Anion gap 7 5 - 15  Lipase, blood     Status: Abnormal   Collection Time: 08/27/18 10:08 AM  Result Value Ref Range   Lipase 76 (H) 11 - 51 U/L  Lactic acid, plasma      Status: None   Collection Time: 08/27/18 10:08 AM  Result Value Ref Range   Lactic Acid, Venous 0.8 0.5 - 1.9 mmol/L  Procalcitonin     Status: None   Collection Time: 08/27/18 10:08 AM  Result Value Ref Range   Procalcitonin <0.10 ng/mL  Influenza panel by PCR (type A & B)     Status: None   Collection Time: 08/27/18 10:50 AM  Result Value Ref Range  Influenza A By PCR NEGATIVE NEGATIVE   Influenza B By PCR NEGATIVE NEGATIVE  Lactic acid, plasma     Status: None   Collection Time: 08/27/18  1:10 PM  Result Value Ref Range   Lactic Acid, Venous 1.0 0.5 - 1.9 mmol/L   ____________________________________________ ____________________________________________  RADIOLOGY  I personally reviewed all radiographic images ordered to evaluate for the above acute complaints and reviewed radiology reports and findings.  These findings were personally discussed with the patient.  Please see medical record for radiology report.  ____________________________________________   PROCEDURES  Procedure(s) performed:  .Critical Care Performed by: Willy Eddy, MD Authorized by: Willy Eddy, MD   Critical care provider statement:    Critical care time (minutes):  15   Critical care was necessary to treat or prevent imminent or life-threatening deterioration of the following conditions:  Sepsis   Critical care was time spent personally by me on the following activities:  Discussions with consultants, evaluation of patient's response to treatment, examination of patient, ordering and performing treatments and interventions, ordering and review of laboratory studies, ordering and review of radiographic studies, pulse oximetry, re-evaluation of patient's condition, obtaining history from patient or surrogate and review of old charts      Critical Care performed: yes ____________________________________________   INITIAL IMPRESSION / ASSESSMENT AND PLAN / ED COURSE  Pertinent labs &  imaging results that were available during my care of the patient were reviewed by me and considered in my medical decision making (see chart for details).   DDX: sepiss, UTI, bacteremia, viral illness, pneumonia, dehydration  Tamara Stevenson is a 78 y.o. who presents to the ED with symptoms as described above.  She is febrile and ill-appearing.  Vital signs are otherwise relatively stable.  Does not have any focal abdominal exam.  Blood work recent for the by differential.  Will provide gentle IV hydration given her history of congestive heart failure.  I am concerned for viral etiology as well and checked will check for influenza as well as COVID-19.  As her lactate is normal and covid 19 is on the differential with normal blood pressure will hold off on any aggressive IV resuscitation.  Clinical Course as of Aug 26 1532  Fri Aug 27, 2018  1103 Discussed case with patient's daughter who states that she does have advanced directives and would not want to be placed on mechanical life support.   [PR]    Clinical Course User Index [PR] Willy Eddy, MD    The patient was evaluated in Emergency Department today for the symptoms described in the history of present illness. He/she was evaluated in the context of the global COVID-19 pandemic, which necessitated consideration that the patient might be at risk for infection with the SARS-CoV-2 virus that causes COVID-19. Institutional protocols and algorithms that pertain to the evaluation of patients at risk for COVID-19 are in a state of rapid change based on information released by regulatory bodies including the CDC and federal and state organizations. These policies and algorithms were followed during the patient's care in the ED.  As part of my medical decision making, I reviewed the following data within the electronic MEDICAL RECORD NUMBER Nursing notes reviewed and incorporated, Labs reviewed, notes from prior ED visits and [place] Controlled Substance  Database   ____________________________________________   FINAL CLINICAL IMPRESSION(S) / ED DIAGNOSES  Final diagnoses:  Acute encephalopathy  Pneumonia of both lower lobes due to infectious organism [facility])      NEW  MEDICATIONS STARTED DURING THIS VISIT:  Current Discharge Medication List       Note:  This document was prepared using Dragon voice recognition software and may include unintentional dictation errors.    Willy Eddyobinson, Tanner Yeley, MD 08/27/18 1534

## 2018-08-27 NOTE — Plan of Care (Signed)
Patient admitted to unit 2A from the emergency department and is under the care of this writer. A&O to self only. Vital signs stable, respirations even and unlabored, no apparent distress noted. Patient's cognitive ability does not permit this nurse to complete required admission documentation; as much information was completed as possible. Purewick in place; Droplet/Contact precautions and Telemetry box placed per MD orders. Skin clean/dry/intact (see full system assessment documented in chart). Patient resting in bed and has expressed no additional questions or concerns at this time. Will cont to monitor.  Christen Butter, RN

## 2018-08-27 NOTE — Progress Notes (Signed)
Advance care planning  Purpose of Encounter Sepsis, Pneumonia  Parties in Attendance Patient an Daughter Rockney Ghee over the phone  Patients Decisional capacity Patient is confused with dementia and unable to make medical decisions.  Documented healthcare power of attorney is daughter Rockney Ghee.  Discussed regarding patient's sepsis, pneumonia and pending test of coronavirus.  All questions answered.  Prognosis and treatment plan discussed.  Discussed regarding CODE STATUS and daughter confirms that patient is DNR/DNI status.  Orders entered and CODE STATUS changed.  DNR/DNI  Time spent - 17 minutes

## 2018-08-27 NOTE — ED Notes (Signed)
ED TO INPATIENT HANDOFF REPORT  ED Nurse Name and Phone #: Victorino Dike [phone]  S Name/Age/Gender Tamara Stevenson 78 y.o. female Room/Bed: ED33A/ED33A  Code Status   Code Status: DNR  Home/SNF/Other Skilled nursing facility Patient oriented to: self Is this baseline? Yes   Triage Complete: Triage complete  Chief Complaint nvd  Triage Note Pt to ER via EMS from [facility] with reports of N/V/D since early AM.  Pt is sleepy, but arouses to voice.  Pt nods yes when asked if she has had a cough.  EMS stated no noted vomiting during their encounter.     Allergies No Known Allergies  Level of Care/Admitting Diagnosis ED Disposition    ED Disposition Condition Comment   Admit  Hospital Area: [facility] [[id number]]  Level of Care: Med-Surg [16]  Diagnosis: Sepsis [facility]) [[id number]]  Admitting Physician: Milagros Loll [[id number]]  Attending Physician: Milagros Loll [[id number]]  Estimated length of stay: past midnight tomorrow  Certification:: I certify this patient will need inpatient services for at least 2 midnights  PT Class (Do Not Modify): Inpatient [101]  PT Acc Code (Do Not Modify): Private [1]       B Medical/Surgery History Past Medical History:  Diagnosis Date  . 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 CHF (congestive heart failure) (HCC)   . Coronary artery disease   . Hyperlipidemia   . Hypertension   . Stroke [facility])    Past Surgical History:  Procedure Laterality Date  . CARDIAC CATHETERIZATION  01/27/2011     A IV Location/Drains/Wounds Patient Lines/Drains/Airways Status   Active Line/Drains/Airways    Name:   Placement date:   Placement time:   Site:   Days:   Peripheral IV 08/27/18 Right Antecubital   08/27/18    1007    Antecubital   less than 1          Intake/Output Last 24 hours No intake or output data in the 24 hours ending 08/27/18 1127  Labs/Imaging Results for orders placed or performed during the  hospital encounter of 08/27/18 (from the past 48 hour(s))  CBC with Differential/Platelet     Status: Abnormal   Collection Time: 08/27/18 10:08 AM  Result Value Ref Range   WBC 4.7 4.0 - 10.5 K/uL   RBC 3.51 (L) 3.87 - 5.11 MIL/uL   Hemoglobin 11.1 (L) 12.0 - 15.0 g/dL   HCT 09.7 (L) 35.3 - 29.9 %   MCV 98.3 80.0 - 100.0 fL   MCH 31.6 26.0 - 34.0 pg   MCHC 32.2 30.0 - 36.0 g/dL   RDW 24.2 68.3 - 41.9 %   Platelets 162 150 - 400 K/uL   nRBC 0.0 0.0 - 0.2 %   Neutrophils Relative % 81 %   Neutro Abs 3.8 1.7 - 7.7 K/uL   Lymphocytes Relative 9 %   Lymphs Abs 0.4 (L) 0.7 - 4.0 K/uL   Monocytes Relative 8 %   Monocytes Absolute 0.4 0.1 - 1.0 K/uL   Eosinophils Relative 2 %   Eosinophils Absolute 0.1 0.0 - 0.5 K/uL   Basophils Relative 0 %   Basophils Absolute 0.0 0.0 - 0.1 K/uL   Immature Granulocytes 0 %   Abs Immature Granulocytes 0.02 0.00 - 0.07 K/uL    Comment: Performed at [facility], [address], [place], [place] [address]  Comprehensive metabolic panel     Status: Abnormal   Collection Time: 08/27/18 10:08 AM  Result Value Ref Range   Sodium 142 135 - 145 mmol/L   Potassium 3.3 (L) 3.5 - 5.1 mmol/L   Chloride 108 98 - 111 mmol/L   CO2 27 22 - 32 mmol/L   Glucose, Bld 90 70 - 99 mg/dL   BUN 25 (H) 8 - 23 mg/dL   Creatinine, Ser 1.610.68 0.44 - 1.00 mg/dL   Calcium 8.3 (L) 8.9 - 10.3 mg/dL   Total Protein 7.0 6.5 - 8.1 g/dL   Albumin 3.5 3.5 - 5.0 g/dL   AST 22 15 - 41 U/L   ALT 17 0 - 44 U/L   Alkaline Phosphatase 100 38 - 126 U/L   Total Bilirubin 0.7 0.3 - 1.2 mg/dL   GFR calc non Af Amer >60 >60 mL/min   GFR calc Af Amer >60 >60 mL/min   Anion gap 7 5 - 15    Comment: Performed at [facility], [address] [place], [place] [address]  Lipase, blood     Status: Abnormal   Collection Time: 08/27/18 10:08 AM  Result Value Ref Range   Lipase 76 (H) 11 - 51 U/L    Comment: Performed at [facility], [address]  [place], [place] [address]  Lactic acid, plasma     Status: None   Collection Time: 08/27/18 10:08 AM  Result Value Ref Range   Lactic Acid, Venous 0.8 0.5 - 1.9 mmol/L    Comment: Performed at [facility], [address] [place], [place] [address]  Procalcitonin     Status: None   Collection Time: 08/27/18 10:08 AM  Result Value Ref Range   Procalcitonin <0.10 ng/mL    Comment:        Interpretation: PCT (Procalcitonin) <= 0.5 ng/mL: Systemic infection (sepsis) is not likely. Local bacterial infection is possible. (NOTE)       Sepsis PCT Algorithm           Lower Respiratory Tract                                      Infection PCT Algorithm    ----------------------------     ----------------------------         PCT < 0.25 ng/mL                PCT < 0.10 ng/mL         Strongly encourage             Strongly discourage   discontinuation of antibiotics    initiation of antibiotics    ----------------------------     -----------------------------       PCT 0.25 - 0.50 ng/mL            PCT 0.10 - 0.25 ng/mL               OR       >80% decrease in PCT            Discourage initiation of                                            antibiotics      Encourage discontinuation           of antibiotics    ----------------------------     -----------------------------  PCT >= 0.50 ng/mL              PCT 0.26 - 0.50 ng/mL               AND        <80% decrease in PCT             Encourage initiation of                                             antibiotics       Encourage continuation           of antibiotics    ----------------------------     -----------------------------        PCT >= 0.50 ng/mL                  PCT > 0.50 ng/mL               AND         increase in PCT                  Strongly encourage                                      initiation of antibiotics    Strongly encourage escalation           of antibiotics                                      -----------------------------                                           PCT <= 0.25 ng/mL                                                 OR                                        > 80% decrease in PCT                                     Discontinue / Do not initiate                                             antibiotics Performed at [facility], [address], [place], [place] [address]    Dg Chest Portable 1 View  Result Date: 08/27/2018 CLINICAL DATA:  Nausea and vomiting.  Cough. EXAM: PORTABLE CHEST 1 VIEW COMPARISON:  None. FINDINGS: The heart is enlarged. There is mild prominence of the interstitial markings throughout both lung fields, RIGHT greater than LEFT, which could represent mild  edema or early infiltrates. Calcified tortuous aorta. No effusion or pneumothorax. No frank consolidation. Skeletal osteopenia. IMPRESSION: Mild prominence interstitial markings throughout both lung fields, RIGHT greater than LEFT which could represent mild edema or early infiltrates. Continued surveillance is warranted. Electronically Signed   By: Elsie Stain M.D.   On: 08/27/2018 10:09    Pending Labs Unresulted Labs (From admission, onward)    Start     Ordered   09/03/18 0500  Creatinine, serum  (enoxaparin (LOVENOX)    CrCl >/= 30 ml/min)  Weekly,   STAT    Comments:  while on enoxaparin therapy    08/27/18 1112   08/28/18 0500  Basic metabolic panel  Tomorrow morning,   STAT     08/27/18 1112   08/28/18 0500  CBC  Tomorrow morning,   STAT     08/27/18 1112   08/27/18 1044  Influenza panel by PCR (type A & B)  (Influenza PCR Panel)  Once,   STAT     08/27/18 1043   08/27/18 1042  Novel Coronavirus, NAA (hospital order; send-out to ref lab)  (Novel Coronavirus, NAA [facility] Order; send-out to ref lab) with precautions panel)  Once,   STAT    Question Answer Comment  Current symptoms Fever and Cough   Excluded other viral illnesses Yes   Exposure Risk Traveled to high risk  areas in the last 14 days   Exposure Risk Contact with a known COVID19 positive person in the last 14 days   Patient immune status Immunocompromised      08/27/18 1041   08/27/18 1005  Lactic acid, plasma  STAT Now then every 3 hours,   STAT     08/27/18 1004   08/27/18 1005  Blood Culture (routine x 2)  BLOOD CULTURE X 2,   STAT     08/27/18 1004   08/27/18 1005  Urine culture  ONCE - STAT,   STAT     08/27/18 1004   08/27/18 0939  Urinalysis, Complete w Microscopic  Once,   STAT     08/27/18 0938          Vitals/Pain Today's Vitals   08/27/18 0939 08/27/18 0954  BP: 133/63   Pulse: 85   Resp: 18   Temp: 99.4 F (37.4 C) (!) 100.9 F (38.3 C)  TempSrc:  Rectal  SpO2: 95%   Weight: 65.8 kg   Height:  (1.651 m)   PainSc: 0-No pain     Isolation Precautions Droplet precaution  Medications Medications  vancomycin (VANCOCIN) 1,500 mg in sodium chloride 0.9 % 500 mL IVPB (has no administration in time range)  enoxaparin (LOVENOX) injection 40 mg (has no administration in time range)  acetaminophen (TYLENOL) tablet 650 mg (has no administration in time range)    Or  acetaminophen (TYLENOL) suppository 650 mg (has no administration in time range)  polyethylene glycol (MIRALAX / GLYCOLAX) packet 17 g (has no administration in time range)  ondansetron (ZOFRAN) tablet 4 mg (has no administration in time range)    Or  ondansetron (ZOFRAN) injection 4 mg (has no administration in time range)  cefTRIAXone (ROCEPHIN) 1 g in sodium chloride 0.9 % 100 mL IVPB (has no administration in time range)  azithromycin (ZITHROMAX) 500 mg in sodium chloride 0.9 % 250 mL IVPB (has no administration in time range)  albuterol (PROVENTIL HFA;VENTOLIN HFA) 108 (90 Base) MCG/ACT inhaler 2 puff (has no administration in time range)  acetaminophen (TYLENOL) tablet 1,000 mg (1,000  mg Oral Given 08/27/18 1031)    Mobility walks with device High fall risk   Focused Assessments Pulmonary  Assessment Handoff:  Lung sounds:            R Recommendations: See Admitting Provider Note  Report given to:   Additional Notes:

## 2018-08-27 NOTE — H&P (Signed)
[facility] - [place] at [facility]   PATIENT NAME: Tamara Stevenson    MR#:  [id number]  DATE OF BIRTH:  11-27-1940  DATE OF ADMISSION:  08/27/2018  PRIMARY CARE PHYSICIAN: [facility]   REQUESTING/REFERRING PHYSICIAN: Dr. Roxan Hockeyobinson  CHIEF COMPLAINT:   Chief Complaint  Patient presents with  . Nausea  . Emesis  . Diarrhea    HISTORY OF PRESENT ILLNESS:  Tamara Stevenson  is a 78 y.o. female with a known history of dementia, diastolic chf, , HTN, HL here with nausea, vomiting, diarrhea and cough  from assisted living facility. Temp 100.9 and CXR showing interstitial edema vs pneumonitis. COVID ordered.  Had dementia and poor historian  PAST MEDICAL HISTORY:   Past Medical History:  Diagnosis Date  . 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 CHF (congestive heart failure) (HCC)   . Coronary artery disease   . Hyperlipidemia   . Hypertension   . Stroke [facility](HCC)     PAST SURGICAL HISTORY:   Past Surgical History:  Procedure Laterality Date  . CARDIAC CATHETERIZATION  01/27/2011    SOCIAL HISTORY:   Social History   Tobacco Use  . Smoking status: Never Smoker  . Smokeless tobacco: Never Used  Substance Use Topics  . Alcohol use: Never    Frequency: Never    FAMILY HISTORY:  History reviewed. No pertinent family history. Unable to obtain due to dementia DRUG ALLERGIES:  No Known Allergies  REVIEW OF SYSTEMS:   Review of Systems  Unable to perform ROS: Dementia    MEDICATIONS AT HOME:   Prior to Admission medications   Medication Sig Start Date End Date Taking? Authorizing Provider  acetaminophen (TYLENOL) 325 MG tablet Take 2 tablets by mouth every 6 (six) hours as needed.    [provider]  alendronate (FOSAMAX) 70 MG tablet Take 70 mg by mouth once a week. 07/03/18   [provider]  aspirin EC 81 MG tablet Take 81 mg by mouth daily.    [provider]  atorvastatin (LIPITOR) 40 MG tablet Take  40 mg by mouth every evening. 06/05/18   [provider]  b complex vitamins capsule Take 1 capsule by mouth daily.    [provider]  Cholecalciferol (VITAMIN D3) 50 MCG (2000 UT) capsule Take 2,000 Units by mouth daily.    [provider]  Cinnamon 500 MG capsule Take 500 mg by mouth daily.    [provider]  folic acid (FOLVITE) 800 MCG tablet Take 800 mcg by mouth daily.    [provider]  memantine (NAMENDA) 10 MG tablet Take 10 mg by mouth daily. 07/05/18   [provider]  metoprolol tartrate (LOPRESSOR) 50 MG tablet Take 50 mg by mouth 2 (two) times daily. 07/03/18   [provider]  Multiple Vitamin (MULTI VITAMIN DAILY) TABS Take 1 tablet by mouth daily.    [provider]  rivastigmine (EXELON) 3 MG capsule Take 3 mg by mouth 2 (two) times daily. 07/05/18   [provider]  sertraline (ZOLOFT) 50 MG tablet Take 1.5 tablets by mouth daily. 06/05/18   [provider]     VITAL SIGNS:  Blood pressure 133/63, pulse 85, temperature (!) 100.9 F (38.3 C), temperature source Rectal, resp. rate 18, height 5\' 5"  (1.651 m), weight 65.8 kg, SpO2 95 %.  PHYSICAL EXAMINATION:  Physical Exam  GENERAL:  78 y.o.-year-old patient lying in the bed with no acute distress.  EYES: Pupils equal, round, reactive to light and accommodation. No scleral icterus. Extraocular muscles intact.  HEENT: Head atraumatic, normocephalic. Oropharynx and nasopharynx clear. No oropharyngeal erythema, moist oral mucosa  NECK:  Supple, no jugular venous distention. No thyroid enlargement, no tenderness.  LUNGS: Normal breath sounds bilaterally, no wheezing, rales, rhonchi. No use of accessory muscles of respiration.  CARDIOVASCULAR: S1, S2 normal. No murmurs, rubs, or gallops.  ABDOMEN: Soft, nontender, nondistended. Bowel sounds present. No organomegaly or mass.  EXTREMITIES: No pedal edema, cyanosis, or clubbing. + 2 pedal &  radial pulses b/l.   NEUROLOGIC: Cranial nerves II through XII are intact. No focal Motor or sensory deficits appreciated b/l PSYCHIATRIC: The patient is alert and awake. confused SKIN: No obvious rash, lesion, or ulcer.   LABORATORY PANEL:   CBC Recent Labs  Lab 08/27/18 1008  WBC 4.7  HGB 11.1*  HCT 34.5*  PLT 162   ------------------------------------------------------------------------------------------------------------------  Chemistries  Recent Labs  Lab 08/27/18 1008  NA 142  K 3.3*  CL 108  CO2 27  GLUCOSE 90  BUN 25*  CREATININE 0.68  CALCIUM 8.3*  AST 22  ALT 17  ALKPHOS 100  BILITOT 0.7   ------------------------------------------------------------------------------------------------------------------  Cardiac Enzymes No results for input(s): TROPONINI in the last 168 hours. ------------------------------------------------------------------------------------------------------------------  RADIOLOGY:  Dg Chest Portable 1 View  Result Date: 08/27/2018 CLINICAL DATA:  Nausea and vomiting.  Cough. EXAM: PORTABLE CHEST 1 VIEW COMPARISON:  None. FINDINGS: The heart is enlarged. There is mild prominence of the interstitial markings throughout both lung fields, RIGHT greater than LEFT, which could represent mild edema or early infiltrates. Calcified tortuous aorta. No effusion or pneumothorax. No frank consolidation. Skeletal osteopenia. IMPRESSION: Mild prominence interstitial markings throughout both lung fields, RIGHT greater than LEFT which could represent mild edema or early infiltrates. Continued surveillance is warranted. Electronically Signed   By: Elsie Stain M.D.   On: 08/27/2018 10:09     IMPRESSION AND PLAN:   * Pneumonia with sepsis COVID pending Start IV abx Cx sent and pending Inhalers PRN O2 PRN  * Nausea, vomiting and diarrhea Gastroenteritis? Will need to r/o bacterial infection if further diarrhea.  * Dementia Monitor for  inpatient delirium  * CHF Monitor for fluid overload  * DVT prophylaxis Lovenox  All the records are reviewed and case discussed with ED provider. Management plans discussed with the patient, family and they are in agreement.  CODE STATUS: DNR  TOTAL TIME TAKING CARE OF THIS PATIENT: 40 minutes.   Molinda Bailiff Donesha Wallander M.D on 08/27/2018 at 11:19 AM  Between 7am to 6pm - Pager - [phone]  After 6pm go to [email or web address] - password [email or web address]  [facility] [place] [facility]  Office  [phone]  CC: Primary care physician; [facility]  Note: This dictation was prepared with Dragon dictation along with smaller phrase technology. Any transcriptional errors that result from this process are unintentional. \

## 2018-08-27 NOTE — ED Triage Notes (Signed)
Pt to ER via EMS from [facility] with reports of N/V/D since early AM.  Pt is sleepy, but arouses to voice.  Pt nods yes when asked if she has had a cough.  EMS stated no noted vomiting during their encounter.

## 2018-08-28 LAB — CBC
HCT: 34.8 % — ABNORMAL LOW (ref 36.0–46.0)
Hemoglobin: 11.1 g/dL — ABNORMAL LOW (ref 12.0–15.0)
MCH: 31.5 pg (ref 26.0–34.0)
MCHC: 31.9 g/dL (ref 30.0–36.0)
MCV: 98.9 fL (ref 80.0–100.0)
Platelets: 153 10*3/uL (ref 150–400)
RBC: 3.52 MIL/uL — ABNORMAL LOW (ref 3.87–5.11)
RDW: 13.4 % (ref 11.5–15.5)
WBC: 6.4 10*3/uL (ref 4.0–10.5)
nRBC: 0 % (ref 0.0–0.2)

## 2018-08-28 LAB — URINE CULTURE: Culture: <10000 — AB

## 2018-08-28 LAB — BASIC METABOLIC PANEL
Anion gap: 9 (ref 5–15)
BUN: 22 mg/dL (ref 8–23)
CO2: 26 mmol/L (ref 22–32)
Calcium: 8.2 mg/dL — ABNORMAL LOW (ref 8.9–10.3)
Chloride: 107 mmol/L (ref 98–111)
Creatinine, Ser: 0.58 mg/dL (ref 0.44–1.00)
GFR calc Af Amer: >60 mL/min (ref >60)
GFR calc non Af Amer: >60 mL/min (ref >60)
Glucose, Bld: 85 mg/dL (ref 70–99)
Potassium: 3.7 mmol/L (ref 3.5–5.1)
Sodium: 142 mmol/L (ref 135–145)

## 2018-08-28 MED ORDER — SODIUM CHLORIDE 0.9 % IV SOLN
500.0000 mg | INTRAVENOUS | Status: DC
  Administered 2018-08-28 – 2018-08-29 (×2): 500 mg via INTRAVENOUS
  Filled 2018-08-28 (×3): qty 500

## 2018-08-28 MED ORDER — SODIUM CHLORIDE 0.9 % IV SOLN
INTRAVENOUS | Status: DC | PRN
  Administered 2018-08-28: 50 mL via INTRAVENOUS
  Administered 2018-08-29: 500 mL via INTRAVENOUS

## 2018-08-28 NOTE — NC FL2 (Signed)
[place] MEDICAID FL2 LEVEL OF CARE SCREENING TOOL     IDENTIFICATION  Patient Name: Tamara Stevenson Birthdate: [birth year] Sex: female Admission Date (Current Location): 08/27/2018  [place] and [place] Number:  Chiropodist and Address:  [facility], [address] [place], [place] [address]      Provider Number: [id number]  Attending Physician Name and Address:  Ramonita Lab, MD  Relative Name and Phone Number:       Current Level of Care: Hospital Recommended Level of Care: Memory Care Prior Approval Number:    Date Approved/Denied:   PASRR Number:    Discharge Plan: Other (Comment)(Memory Care )    Current Diagnoses: Patient Active Problem List   Diagnosis Date Noted  . Sepsis (HCC) 08/27/2018    Orientation RESPIRATION BLADDER Height & Weight     Self  Normal Incontinent Weight: 147 lb 11.2 oz (67 kg) Height:  5\' 5"  (165.1 cm)  BEHAVIORAL SYMPTOMS/MOOD NEUROLOGICAL BOWEL NUTRITION STATUS  (none) (none) Continent Diet(Soft diet )  AMBULATORY STATUS COMMUNICATION OF NEEDS Skin   Limited Assist Verbally Normal                       Personal Care Assistance Level of Assistance  Bathing, Feeding, Dressing Bathing Assistance: Limited assistance Feeding assistance: Independent Dressing Assistance: Limited assistance     Functional Limitations Info  Sight, Hearing, Speech Sight Info: Adequate Hearing Info: Adequate Speech Info: Adequate    SPECIAL CARE FACTORS FREQUENCY                       Contractures Contractures Info: Not present    Additional Factors Info  Code Status, Allergies, Isolation Precautions Code Status Info: DNR Allergies Info: NKA     Isolation Precautions Info: Droplet      Current Medications (08/28/2018):  This is the current hospital active medication list Current Facility-Administered Medications  Medication Dose Route Frequency Provider Last Rate Last Dose  . 0.9 %  sodium  chloride infusion   Intravenous PRN Ramonita Lab, MD 10 mL/hr at 08/28/18 1000    . acetaminophen (TYLENOL) tablet 650 mg  650 mg Oral Q6H PRN Milagros Loll, MD       Or  . acetaminophen (TYLENOL) suppository 650 mg  650 mg Rectal Q6H PRN Sudini, Srikar, MD      . albuterol (PROVENTIL HFA;VENTOLIN HFA) 108 (90 Base) MCG/ACT inhaler 2 puff  2 puff Inhalation Q6H PRN Sudini, Wardell Heath, MD      . azithromycin (ZITHROMAX) 500 mg in sodium chloride 0.9 % 250 mL IVPB  500 mg Intravenous Q24H Gouru, Aruna, MD      . cefTRIAXone (ROCEPHIN) 1 g in sodium chloride 0.9 % 100 mL IVPB  1 g Intravenous Q24H Milagros Loll, MD   Stopped at 08/28/18 0955  . enoxaparin (LOVENOX) injection 40 mg  40 mg Subcutaneous Q24H Milagros Loll, MD   40 mg at 08/27/18 2255  . ondansetron (ZOFRAN) tablet 4 mg  4 mg Oral Q6H PRN Sudini, Wardell Heath, MD       Or  . ondansetron (ZOFRAN) injection 4 mg  4 mg Intravenous Q6H PRN Sudini, Srikar, MD      . polyethylene glycol (MIRALAX / GLYCOLAX) packet 17 g  17 g Oral Daily PRN Milagros Loll, MD         Discharge Medications: Please see discharge summary for a list of discharge medications.  Relevant Imaging Results:  Relevant Lab Results:   Additional Information    Zaxton Angerer  Louretta Shorten, LCSWA

## 2018-08-28 NOTE — TOC Initial Note (Signed)
Transition of Care [facility]) - Initial/Assessment Note    Patient Details  Name: Tamara Stevenson MRN: [id number] Date of Birth: [birth year]  Transition of Care [facility]) CM/SW Contact:    Ruthe Mannan, LCSWA Phone Number: 08/28/2018, 2:45 PM  Clinical Narrative:   CSW found through chart review that patient is from [facility] assisted living. CSW contacted patient's daughter  Rockney Ghee [phone] due to patient being in isolation for possible Covid-19. Daughter reports that patient is confused at baseline and lives in the memory care unit at [facility]. Per Daughter, patient has lived at [facility] since December of 2019. Daughter states that patient is mostly wheelchair bound but has been receiving Physical therapy at the facility. Daughter states that she cant remember what company is doing the therapy. Per Daughter, they expect patient to return to [facility] when medically ready. TOC team will follow for discharge planning.                Expected Discharge Plan: Memory Care Barriers to Discharge: Continued Medical Work up   Patient Goals and CMS Choice Patient states their goals for this hospitalization and ongoing recovery are:: Daughter states that she expects patient to return to memory care       Expected Discharge Plan and Services Expected Discharge Plan: Memory Care       Living arrangements for the past 2 months: Assisted Living Facility                          Prior Living Arrangements/Services Living arrangements for the past 2 months: Assisted Living Facility Lives with:: Facility Resident Patient language and need for interpreter reviewed:: Yes Do you feel safe going back to the place where you live?: Yes      Need for Family Participation in Patient Care: Yes (Comment) Care giver support system in place?: Yes (comment) Current home services: Home PT Criminal Activity/Legal Involvement Pertinent to Current Situation/Hospitalization: No - Comment as  needed  Activities of Daily Living Home Assistive Devices/Equipment: None ADL Screening (condition at time of admission) Patient's cognitive ability adequate to safely complete daily activities?: No Is the patient deaf or have difficulty hearing?: No Does the patient have difficulty seeing, even when wearing glasses/contacts?: No Does the patient have difficulty concentrating, remembering, or making decisions?: Yes Patient able to express need for assistance with ADLs?: Yes Does the patient have difficulty dressing or bathing?: Yes(Intermittently) Independently performs ADLs?: No Communication: Needs assistance Is this a change from baseline?: Pre-admission baseline Dressing (OT): Needs assistance Is this a change from baseline?: Pre-admission baseline Grooming: Needs assistance Is this a change from baseline?: Pre-admission baseline Feeding: Needs assistance Is this a change from baseline?: Pre-admission baseline Bathing: Needs assistance Is this a change from baseline?: Pre-admission baseline Toileting: Needs assistance Is this a change from baseline?: Pre-admission baseline In/Out Bed: Dependent Is this a change from baseline?: Pre-admission baseline Walks in Home: Dependent Is this a change from baseline?: Pre-admission baseline Does the patient have difficulty walking or climbing stairs?: Yes Weakness of Legs: Both Weakness of Arms/Hands: Both  Permission Sought/Granted Permission sought to share information with : Case Manager, Magazine features editor, Family Supports Permission granted to share information with : Yes, Verbal Permission Granted  Share Information with NAME: Daughter   Permission granted to share info w AGENCY: [place] [place]         Emotional Assessment Appearance:: Appears stated age     Orientation: :  Oriented to Self Alcohol / Substance Use: Not Applicable Psych Involvement: No (comment)  Admission diagnosis:  Acute encephalopathy  [G93.40] Pneumonia of both lower lobes due to infectious organism [facility](HCC) [J18.1] Patient Active Problem List   Diagnosis Date Noted  . Sepsis (HCC) 08/27/2018   PCP:  [facility] Pharmacy:  No Pharmacies Listed    Social Determinants of Health (SDOH) Interventions    Readmission Risk Interventions No flowsheet data found.

## 2018-08-28 NOTE — Progress Notes (Signed)
[facility] - [place] at [facility]   PATIENT NAME: Tamara LairJoyce Polkowski    MR#:  [id number]  DATE OF BIRTH:  1940/12/09  SUBJECTIVE:  CHIEF COMPLAINT: Patient is a poor historian from underlying dementia  REVIEW OF SYSTEMS:  Unobtainable from dementia  DRUG ALLERGIES:  No Known Allergies  VITALS:  Blood pressure 134/65, pulse 94, temperature 98.3 F (36.8 C), temperature source Oral, resp. rate 19, height 5\' 5"  (1.651 m), weight 67 kg, SpO2 94 %.  PHYSICAL EXAMINATION:  GENERAL:  78 y.o.-year-old patient lying in the bed with no acute distress.  EYES: Pupils equal, round, reactive to light and accommodation. No scleral icterus. Extraocular muscles intact.  HEENT: Head atraumatic, normocephalic. Oropharynx and nasopharynx clear.  NECK:  Supple, no jugular venous distention. No thyroid enlargement, no tenderness.  LUNGS: Normal breath sounds bilaterally, no wheezing, rales,rhonchi or crepitation. No use of accessory muscles of respiration.  CARDIOVASCULAR: S1, S2 normal. No murmurs, rubs, or gallops.  ABDOMEN: Soft, nontender, nondistended. Bowel sounds present. No organomegaly or mass.  EXTREMITIES: No pedal edema, cyanosis, or clubbing.  NEUROLOGIC: Awake and alert chronic left-sided weakness from old stroke pSYCHIATRIC: The patient is alert and disoriented SKIN: No obvious rash, lesion, or ulcer.    LABORATORY PANEL:   CBC Recent Labs  Lab 08/28/18 0320  WBC 6.4  HGB 11.1*  HCT 34.8*  PLT 153   ------------------------------------------------------------------------------------------------------------------  Chemistries  Recent Labs  Lab 08/27/18 1008 08/28/18 0320  NA 142 142  K 3.3* 3.7  CL 108 107  CO2 27 26  GLUCOSE 90 85  BUN 25* 22  CREATININE 0.68 0.58  CALCIUM 8.3* 8.2*  AST 22  --   ALT 17  --   ALKPHOS 100  --   BILITOT 0.7  --     ------------------------------------------------------------------------------------------------------------------  Cardiac Enzymes No results for input(s): TROPONINI in the last 168 hours. ------------------------------------------------------------------------------------------------------------------  RADIOLOGY:  Dg Chest Portable 1 View  Result Date: 08/27/2018 CLINICAL DATA:  Nausea and vomiting.  Cough. EXAM: PORTABLE CHEST 1 VIEW COMPARISON:  None. FINDINGS: The heart is enlarged. There is mild prominence of the interstitial markings throughout both lung fields, RIGHT greater than LEFT, which could represent mild edema or early infiltrates. Calcified tortuous aorta. No effusion or pneumothorax. No frank consolidation. Skeletal osteopenia. IMPRESSION: Mild prominence interstitial markings throughout both lung fields, RIGHT greater than LEFT which could represent mild edema or early infiltrates. Continued surveillance is warranted. Electronically Signed   By: Elsie StainJohn T Curnes M.D.   On: 08/27/2018 10:09    EKG:   Orders placed or performed during the hospital encounter of 08/12/18  . ED EKG  . ED EKG  . EKG 12-Lead  . EKG 12-Lead    ASSESSMENT AND PLAN:    * Pneumonia  Clinically better continue IV antibiotics Rocephin and azithromycin COVID pending Cx sent and pending Inhalers PRN O2 PRN No leukocytosis lactic acid in the normal range and procalcitonin less than 0.10  * Nausea, vomiting and diarrhea Gastroenteritis? Clinically improving Tolerating advance of diet Will need to r/o bacterial infection if further diarrhea.  * Dementia Monitor for inpatient delirium  * CHF Monitor for fluid overload.  No symptoms and signs of fluid overload at this time  * DVT prophylaxis Lovenox    All the records are reviewed and case discussed with Care Management/Social Workerr. Management plans discussed with the patient, family and they are in agreement.  CODE STATUS:  dnr   TOTAL TIME TAKING CARE  OF THIS PATIENT: 35  minutes.   POSSIBLE D/C IN 2  DAYS, DEPENDING ON CLINICAL CONDITION.  Note: This dictation was prepared with Dragon dictation along with smaller phrase technology. Any transcriptional errors that result from this process are unintentional.   Ramonita Lab M.D on 08/28/2018 at 4:37 PM  Between 7am to 6pm - Pager - [phone] After 6pm go to [email or web address] - password [email or web address] [facility]  [place] [facility]  Office  [phone]  CC: Primary care physician; [facility]

## 2018-08-28 NOTE — Plan of Care (Signed)
  Problem: Education: Goal: Knowledge of General Education information will improve Description Including pain rating scale, medication(s)/side effects and non-pharmacologic comfort measures Outcome: Not Progressing Note:  Pt confused and unable to retain education provided.   Problem: Health Behavior/Discharge Planning: Goal: Ability to manage health-related needs will improve Outcome: Progressing   Problem: Clinical Measurements: Goal: Ability to maintain clinical measurements within normal limits will improve Outcome: Progressing Goal: Will remain free from infection Outcome: Progressing Goal: Diagnostic test results will improve Outcome: Progressing Goal: Respiratory complications will improve Outcome: Progressing Goal: Cardiovascular complication will be avoided Outcome: Progressing   Problem: Activity: Goal: Risk for activity intolerance will decrease Outcome: Progressing   Problem: Nutrition: Goal: Adequate nutrition will be maintained Outcome: Progressing   Problem: Coping: Goal: Level of anxiety will decrease Outcome: Progressing   Problem: Elimination: Goal: Will not experience complications related to bowel motility Outcome: Progressing Goal: Will not experience complications related to urinary retention Outcome: Progressing   Problem: Pain Managment: Goal: General experience of comfort will improve Outcome: Progressing   Problem: Safety: Goal: Ability to remain free from injury will improve Outcome: Progressing Note:  Low bed provided for safety.   Problem: Skin Integrity: Goal: Risk for impaired skin integrity will decrease Outcome: Progressing Note:  Prophylactic foam dressing applied to coccyx to prevent pressure ulcer.   Problem: Activity: Goal: Ability to tolerate increased activity will improve Outcome: Progressing   Problem: Clinical Measurements: Goal: Ability to maintain a body temperature in the normal range will improve Outcome:  Progressing   Problem: Respiratory: Goal: Ability to maintain adequate ventilation will improve Outcome: Progressing Note:  Placed in semi- to high-Fowler's to promote diaphragmatic expansion and improved gas exchange. Goal: Ability to maintain a clear airway will improve Outcome: Progressing

## 2018-08-29 LAB — STREP PNEUMONIAE URINARY ANTIGEN: Strep Pneumo Urinary Antigen: NEGATIVE

## 2018-08-29 NOTE — Plan of Care (Signed)
Pt is alert, oriented to self only, and interacting appropriately with Clinical research associate.  Pt is cooperative and calm. Fine crackles auscultated in bilateral bases.  Family updated regarding pt's progress and improved status.   Problem: Education: Goal: Knowledge of General Education information will improve Description Including pain rating scale, medication(s)/side effects and non-pharmacologic comfort measures Outcome: Progressing   Problem: Health Behavior/Discharge Planning: Goal: Ability to manage health-related needs will improve Outcome: Progressing   Problem: Clinical Measurements: Goal: Ability to maintain clinical measurements within normal limits will improve Outcome: Progressing Goal: Will remain free from infection Outcome: Progressing Goal: Diagnostic test results will improve Outcome: Progressing Goal: Respiratory complications will improve Outcome: Progressing Goal: Cardiovascular complication will be avoided Outcome: Progressing   Problem: Activity: Goal: Risk for activity intolerance will decrease Outcome: Progressing   Problem: Nutrition: Goal: Adequate nutrition will be maintained Outcome: Progressing   Problem: Coping: Goal: Level of anxiety will decrease Outcome: Progressing   Problem: Elimination: Goal: Will not experience complications related to bowel motility Outcome: Progressing Goal: Will not experience complications related to urinary retention Outcome: Progressing   Problem: Pain Managment: Goal: General experience of comfort will improve Outcome: Progressing   Problem: Safety: Goal: Ability to remain free from injury will improve Outcome: Progressing   Problem: Skin Integrity: Goal: Risk for impaired skin integrity will decrease Outcome: Progressing   Problem: Activity: Goal: Ability to tolerate increased activity will improve Outcome: Progressing   Problem: Clinical Measurements: Goal: Ability to maintain a body temperature in the  normal range will improve Outcome: Progressing   Problem: Respiratory: Goal: Ability to maintain adequate ventilation will improve Outcome: Progressing Goal: Ability to maintain a clear airway will improve Outcome: Progressing   Problem: Urinary Elimination: Goal: Signs and symptoms of infection will decrease Outcome: Progressing

## 2018-08-29 NOTE — Plan of Care (Signed)
  Problem: Education: Goal: Knowledge of General Education information will improve Description Including pain rating scale, medication(s)/side effects and non-pharmacologic comfort measures Outcome: Not Progressing  Pt with dementia Problem: Elimination: Goal: Will not experience complications related to urinary retention Outcome: Progressing   Problem: Safety: Goal: Ability to remain free from injury will improve Outcome: Progressing   Problem: Clinical Measurements: Goal: Ability to maintain a body temperature in the normal range will improve Outcome: Progressing   Problem: Urinary Elimination: Goal: Signs and symptoms of infection will decrease Outcome: Progressing

## 2018-08-29 NOTE — Progress Notes (Signed)
Found pt with legs over the side rails.  Pt confused (believes herself to be 78 years old) and trying to climb out to bed.  Pt reoriented and repositioned for comfort and safety.  Fed patient evening meal.  Floor mats placed for safety. Mittens on for safety.

## 2018-08-29 NOTE — Progress Notes (Signed)
[facility] [place] [facility]   PATIENT NAME: Tamara LairJoyce Satter    MR#:  [id number]  DATE OF BIRTH:  Oct 04, 1940  SUBJECTIVE:  CHIEF COMPLAINT: Patient is a poor historian from underlying dementia COVID pending.  More alert today  REVIEW OF SYSTEMS:  Unobtainable from dementia  DRUG ALLERGIES:  No Known Allergies  VITALS:  Blood pressure (!) 141/57, pulse 76, temperature 98.6 F (37 C), temperature source Oral, resp. rate 16, height 5\' 5"  (1.651 m), weight 65.8 kg, SpO2 96 %.  PHYSICAL EXAMINATION:  GENERAL:  78 y.o.-year-old patient lying in the bed with no acute distress.  EYES: Pupils equal, round, reactive to light and accommodation. No scleral icterus. Extraocular muscles intact.  HEENT: Head atraumatic, normocephalic. Oropharynx and nasopharynx clear.  NECK:  Supple, no jugular venous distention. No thyroid enlargement, no tenderness.  LUNGS: Moderate breath sounds bilaterally, no wheezing, rales,rhonchi or crepitation. No use of accessory muscles of respiration.  CARDIOVASCULAR: S1, S2 normal. No murmurs, rubs, or gallops.  ABDOMEN: Soft, nontender, nondistended. Bowel sounds present. No organomegaly or mass.  EXTREMITIES: No pedal edema, cyanosis, or clubbing.  NEUROLOGIC: Awake and alert chronic left-sided weakness from old stroke pSYCHIATRIC: The patient is alert and disoriented SKIN: No obvious rash, lesion, or ulcer.    LABORATORY PANEL:   CBC Recent Labs  Lab 08/28/18 0320  WBC 6.4  HGB 11.1*  HCT 34.8*  PLT 153   ------------------------------------------------------------------------------------------------------------------  Chemistries  Recent Labs  Lab 08/27/18 1008 08/28/18 0320  NA 142 142  K 3.3* 3.7  CL 108 107  CO2 27 26  GLUCOSE 90 85  BUN 25* 22  CREATININE 0.68 0.58  CALCIUM 8.3* 8.2*  AST 22  --   ALT 17  --   ALKPHOS 100  --   BILITOT 0.7  --     ------------------------------------------------------------------------------------------------------------------  Cardiac Enzymes No results for input(s): TROPONINI in the last 168 hours. ------------------------------------------------------------------------------------------------------------------  RADIOLOGY:  No results found.  EKG:   Orders placed or performed during the hospital encounter of 08/12/18  . ED EKG  . ED EKG  . EKG 12-Lead  . EKG 12-Lead    ASSESSMENT AND PLAN:    * Pneumonia  Clinically improving  continue IV antibiotics Rocephin and azithromycin COVID pending Cultures-blood cultures negative so far.  MRSA PCR negative.  Urine culture with insignificant growth Inhalers PRN O2 PRN No leukocytosis lactic acid in the normal range and procalcitonin less than 0.10  * Nausea, vomiting and diarrhea-acute gastroenteritis Clinically improving Tolerating advance of diet Will need to r/o bacterial infection if further diarrhea.  * Dementia Monitor for inpatient delirium  * CHF Monitor for fluid overload.  No symptoms and signs of fluid overload at this time  * DVT prophylaxis Lovenox    All the records are reviewed and case discussed with Care Management/Social Workerr. Management plans discussed with the patient, family and they are in agreement.  CODE STATUS: dnr   TOTAL TIME TAKING CARE OF THIS PATIENT: 35  minutes.   POSSIBLE D/C IN  1  DAYS, DEPENDING ON CLINICAL CONDITION.  Note: This dictation was prepared with Dragon dictation along with smaller phrase technology. Any transcriptional errors that result from this process are unintentional.   Ramonita LabAruna Rochell Mabie M.D on 08/29/2018 at 1:53 PM  Between 7am to 6pm - Pager - [phone] After 6pm go to [email or web address] - password [email or web address] [facility]  [facility]  Office  [phone]  CC: Primary care physician; [facility]

## 2018-08-30 LAB — NOVEL CORONAVIRUS, NAA (HOSP ORDER, SEND-OUT TO REF LAB; TAT 18-24 HRS): SARS-CoV-2, NAA: NOT DETECTED

## 2018-08-30 MED ORDER — METOPROLOL TARTRATE 50 MG PO TABS: 25.0000 mg | ORAL_TABLET | Freq: Two times a day (BID) | ORAL | 0 refills | Status: AC

## 2018-08-30 MED ORDER — ALBUTEROL SULFATE HFA 108 (90 BASE) MCG/ACT IN AERS: 2.0000 | INHALATION_SPRAY | Freq: Four times a day (QID) | RESPIRATORY_TRACT | Status: AC | PRN

## 2018-08-30 MED ORDER — AZITHROMYCIN 250 MG PO TABS: ORAL_TABLET | ORAL | 0 refills | Status: AC

## 2018-08-30 MED ORDER — ACETAMINOPHEN 325 MG PO TABS: 650.0000 mg | ORAL_TABLET | Freq: Four times a day (QID) | ORAL | Status: AC | PRN

## 2018-08-30 MED ORDER — CEFDINIR 300 MG PO CAPS: 300.0000 mg | ORAL_CAPSULE | Freq: Two times a day (BID) | ORAL | 0 refills | Status: AC

## 2018-08-30 NOTE — TOC Transition Note (Addendum)
Transition of Care [facility]) - CM/SW Discharge Note   Patient Details  Name: Tamara Stevenson MRN: [id number] Date of Birth: 06-13-1940  [facility] CM/SW Contact:  Darleene Cleaver, LCSW Phone Number: 08/30/2018, 4:22 PM   Clinical Narrative:     Patient tested negative for Covid-19 per MD. Patient to be d/c'ed today to [facility].  Patient and family agreeable to plans will transport via ems RN to call report to [phone].  CSW notified patient's daughter Waynetta Sandy that patient will be discharging today.  Final next level of care: Assisted Living([facility].) Barriers to Discharge: Barriers Resolved   Patient Goals and CMS Choice Patient states their goals for this hospitalization and ongoing recovery are:: Daughter states that she expects patient to return to memory care    Choice offered to / list presented to : NA  Discharge Placement              Patient chooses bed at: Other - please specify in the comment section below:([facility]) Patient to be transferred to facility by: [facility] Name of family member notified: Patient's daughter Waynetta Sandy, [phone] Patient and family notified of of transfer: 08/30/18  Discharge Plan and Services Patient discharging back to [facility].                 DME Arranged: N/A DME Agency: NA HH Arranged: NA HH Agency: NA   Social Determinants of Health (SDOH) Interventions     Readmission Risk Interventions No flowsheet data found.

## 2018-08-30 NOTE — Plan of Care (Signed)
  Problem: Education: Goal: Knowledge of General Education information will improve Description Including pain rating scale, medication(s)/side effects and non-pharmacologic comfort measures Outcome: Not Progressing Note:  Patient with a history of dementia. Only A + O x 1. Unsure of baseline. Will continue to monitor neurological function. Jari Favre [facility]

## 2018-08-30 NOTE — Discharge Summary (Signed)
[facility] [place] at [facility]   PATIENT NAME: Tamara Stevenson    MR#:  [id number]  DATE OF BIRTH:  1940-06-30  DATE OF ADMISSION:  08/27/2018 ADMITTING PHYSICIAN: Milagros LollSrikar Sudini, MD  DATE OF DISCHARGE: 08/30/2018  PRIMARY CARE PHYSICIAN: [facility]    ADMISSION DIAGNOSIS:  Acute encephalopathy [G93.40] Pneumonia of both lower lobes due to infectious organism (HCC) [J18.1]  DISCHARGE DIAGNOSIS:  Active Problems:   Sepsis (HCC) Pneumonia  SECONDARY DIAGNOSIS:   Past Medical History:  Diagnosis Date  . 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 CHF (congestive heart failure) (HCC)   . Coronary artery disease   . Hyperlipidemia   . Hypertension   . Stroke [facility](HCC)     HOSPITAL COURSE:  HPI Tamara LairJoyce Stevenson  is a 78 y.o. female with a known history of dementia, diastolic chf, , HTN, HL here with nausea, vomiting, diarrhea and cough  from assisted living facility. Temp 100.9 and CXR showing interstitial edema vs pneumonitis. COVID ordered.  Had dementia and poor historian  *Pneumonia  Clinically improved with IV antibiotics Rocephin and azithromycin.  Discharged with p.o. antibiotics COVID negative Cultures-blood cultures negative so far.  MRSA PCR negative.  Urine culture with insignificant growth Inhalers PRN O2 PRN No leukocytosis lactic acid in the normal range and procalcitonin less than 0.10  * Nausea, vomiting and diarrhea-acute gastroenteritis Clinically improved Tolerating advance of diet Will need to r/o bacterial infection if further diarrhea.  * Dementia Monitor for inpatient delirium  * CHF Monitor for fluid overload.  No symptoms and signs of fluid overload at this time  * DVT prophylaxis Lovenox  DISCHARGE CONDITIONS:   Fair  CONSULTS OBTAINED:     PROCEDURES  none  DRUG ALLERGIES:  No Known Allergies  DISCHARGE MEDICATIONS:   Allergies as of 08/30/2018   No Known Allergies      Medication List    TAKE these medications   acetaminophen 325 MG tablet Commonly known as:  TYLENOL Take 2 tablets (650 mg total) by mouth every 6 (six) hours as needed for mild pain (or Fever >/= 101).   albuterol 108 (90 Base) MCG/ACT inhaler Commonly known as:  PROVENTIL HFA;VENTOLIN HFA Inhale 2 puffs into the lungs every 6 (six) hours as needed for wheezing or shortness of breath.   alendronate 70 MG tablet Commonly known as:  FOSAMAX Take 70 mg by mouth every Sunday.   aspirin EC 81 MG tablet Take 81 mg by mouth daily.   atorvastatin 40 MG tablet Commonly known as:  LIPITOR Take 40 mg by mouth daily.   azithromycin 250 MG tablet Commonly known as:  Zithromax Z-Pak Take 2 tablets (500 mg) on  Day 1,  followed by 1 tablet (250 mg) once daily on Days 2 through 5. Start taking on:  August 31, 2018   cefdinir 300 MG capsule Commonly known as:  OMNICEF Take 1 capsule (300 mg total) by mouth 2 (two) times daily.   memantine 10 MG tablet Commonly known as:  NAMENDA Take 10 mg by mouth 2 (two) times daily.   metoprolol tartrate 50 MG tablet Commonly known as:  LOPRESSOR Take 0.5 tablets (25 mg total) by mouth 2 (two) times daily. What changed:  how much to take   Multi Vitamin Daily Tabs Take 1 tablet by mouth daily.   rivastigmine 3 MG capsule Commonly known as:  EXELON Take 3 mg by mouth 2 (two) times daily.   sertraline  50 MG tablet Commonly known as:  ZOLOFT Take 75 mg by mouth daily.   traZODone 50 MG tablet Commonly known as:  DESYREL Take 50 mg by mouth at bedtime.   vitamin C 1000 MG tablet Take 1,000 mg by mouth daily.   Vitamin D3 50 MCG (2000 UT) capsule Take 2,000 Units by mouth daily.        DISCHARGE INSTRUCTIONS:   Follow-up with primary care physician at the facility in 3 days Daily weight monitoring, intake and output  DIET:  Cardiac diet  DISCHARGE CONDITION:  Fair  ACTIVITY:  Activity as tolerated  OXYGEN:  Home Oxygen:  No.   Oxygen Delivery: room air  DISCHARGE LOCATION:  [facility]  If you experience worsening of your admission symptoms, develop shortness of breath, life threatening emergency, suicidal or homicidal thoughts you must seek medical attention immediately by calling 911 or calling your MD immediately  if symptoms less severe.  You Must read complete instructions/literature along with all the possible adverse reactions/side effects for all the Medicines you take and that have been prescribed to you. Take any new Medicines after you have completely understood and accpet all the possible adverse reactions/side effects.   Please note  You were cared for by a hospitalist during your hospital stay. If you have any questions about your discharge medications or the care you received while you were in the hospital after you are discharged, you can call the unit and asked to speak with the hospitalist on call if the hospitalist that took care of you is not available. Once you are discharged, your primary care physician will handle any further medical issues. Please note that NO REFILLS for any discharge medications will be authorized once you are discharged, as it is imperative that you return to your primary care physician (or establish a relationship with a primary care physician if you do not have one) for your aftercare needs so that they can reassess your need for medications and monitor your lab values.     Today  Chief Complaint  Patient presents with  . Nausea  . Emesis  . Diarrhea   Patient is doing okay has baseline dementia, seems to be at her baseline  ROS: Not obtainable  VITAL SIGNS:  Blood pressure (!) 144/72, pulse 77, temperature 98.5 F (36.9 C), temperature source Oral, resp. rate 20, height 5\' 5"  (1.651 m), weight 65.8 kg, SpO2 96 %.  I/O:    Intake/Output Summary (Last 24 hours) at 08/30/2018 1242 Last data filed at 08/30/2018 0415 Gross per 24 hour  Intake 534.95 ml   Output 1100 ml  Net -565.05 ml    PHYSICAL EXAMINATION:  GENERAL:  78 y.o.-year-old patient lying in the bed with no acute distress.  EYES: Pupils equal, round, reactive to light and accommodation. No scleral icterus. Extraocular muscles intact.  HEENT: Head atraumatic, normocephalic. Oropharynx and nasopharynx clear.  NECK:  Supple, no jugular venous distention. No thyroid enlargement, no tenderness.  LUNGS: Normal breath sounds bilaterally, no wheezing, rales,rhonchi or crepitation. No use of accessory muscles of respiration.  CARDIOVASCULAR: S1, S2 normal. No murmurs, rubs, or gallops.  ABDOMEN: Soft, non-tender, non-distended. Bowel sounds present. No organomegaly or mass.  EXTREMITIES: No pedal edema, cyanosis, or clubbing.  NEUROLOGIC: Cranial nerves II through XII are intact. Muscle strength 5/5 in all extremities. Sensation intact. Gait not checked.  PSYCHIATRIC: The patient is alert and oriented x 3.  SKIN: No obvious rash, lesion, or ulcer.   DATA  REVIEW:   CBC Recent Labs  Lab 08/28/18 0320  WBC 6.4  HGB 11.1*  HCT 34.8*  PLT 153    Chemistries  Recent Labs  Lab 08/27/18 1008 08/28/18 0320  NA 142 142  K 3.3* 3.7  CL 108 107  CO2 27 26  GLUCOSE 90 85  BUN 25* 22  CREATININE 0.68 0.58  CALCIUM 8.3* 8.2*  AST 22  --   ALT 17  --   ALKPHOS 100  --   BILITOT 0.7  --     Cardiac Enzymes No results for input(s): TROPONINI in the last 168 hours.  Microbiology Results  Results for orders placed or performed during the hospital encounter of 08/27/18  Blood Culture (routine x 2)     Status: None (Preliminary result)   Collection Time: 08/27/18 10:07 AM  Result Value Ref Range Status   Specimen Description BLOOD LAC  Final   Special Requests   Final    BOTTLES DRAWN AEROBIC AND ANAEROBIC Blood Culture adequate volume   Culture   Final    NO GROWTH 3 DAYS Performed at [facility], [address], [place], [place] [address]    Report Status  PENDING  Incomplete  Blood Culture (routine x 2)     Status: None (Preliminary result)   Collection Time: 08/27/18 10:09 AM  Result Value Ref Range Status   Specimen Description BLOOD RAC  Final   Special Requests   Final    BOTTLES DRAWN AEROBIC AND ANAEROBIC Blood Culture adequate volume   Culture   Final    NO GROWTH 3 DAYS Performed at [facility], [address], [place], [place] [address]    Report Status PENDING  Incomplete  Novel Coronavirus, NAA (hospital order; send-out to ref lab)     Status: None   Collection Time: 08/27/18 10:50 AM  Result Value Ref Range Status   SARS-CoV-2, NAA NOT DETECTED NOT DETECTED Final    Comment: Negative (Not Detected) results do not exclude infection caused by SARS CoV 2 and should not be used as the sole basis for treatment or other patient management decisions. Optimum specimen types and timing for peak viral levels during infections caused  by SARS CoV 2 have not been determined. Collection of multiple specimens (types and time points) from the same patient may be necessary to detect the virus. Improper specimen collection and handling, sequence variability underlying assay primers and or probes, or the presence of organisms in  quantities less than the limit of detection of the assay may lead to false negative results. Positive and negative predictive values of testing are highly dependent on prevalence. False negative results are more likely when prevalence of disease is high. (NOTE) The expected result is Negative (Not Detected). The SARS CoV 2 test is intended for the presumptive qualitative  detection of nucleic acid from SARS CoV 2 in upper and lower  respir atory specimens. Testing methodology is real time RT PCR. Test results must be correlated with clinical presentation and  evaluated in the context of other laboratory and epidemiologic data.  Test performance can be affected because the epidemiology and  clinical spectrum  of infection caused by SARS CoV 2 is not fully  known. For example, the optimum types of specimens to collect and  when during the course of infection these specimens are most likely  to contain detectable viral RNA may not be known. This test has not been Education officer, environmental (FDA) cleared  or  approved and has been authorized by FDA under an Emergency Use  Authorization (EUA). The test is only authorized for the duration of  the declaration that circumstances exist justifying the authorization  of emergency use of in vitro diagnostic tests for detection and or  diagnosis of SARS CoV 2 under Section 564(b)(1) of the Act, 21 U.S.C.  section [phone] 3(b)(1), unless the authorization is terminated or   revoked sooner. [facility] is certified under the  Clinical Laboratory Improvement Amendments of 1988 (CLIA), 42 U.S.C.  section [phone], to perform high complexity tests. Performed at [facility] CLIA [id number] [address], [place], [place] [address] Laboratory Director: Turner Daniels, MD Fact Sheet for Healthcare Providers  https://pope.com/ Fact Sheet for Patients  BoilerBrush.[employer].cy Performed at [facility], [address] [place], [place] [address]    Coronavirus Source NASOPHARYNGEAL  Final    Comment: Performed at [facility], [address], [place], [place] [address]  Urine culture     Status: Abnormal   Collection Time: 08/27/18  4:39 PM  Result Value Ref Range Status   Specimen Description   Final    URINE, RANDOM Performed at [facility], [address] [place], [place] [address]    Special Requests   Final    NONE Performed at [facility], [address] [place], [place] [address]    Culture (A)  Final    <10,000 COLONIES/mL INSIGNIFICANT GROWTH Performed at [facility], [address], [place], [place]  [address]    Report Status 08/28/2018 FINAL  Final  MRSA PCR Screening     Status: None   Collection Time: 08/27/18  4:39 PM  Result Value Ref Range Status   MRSA by PCR NEGATIVE NEGATIVE Final    Comment:        The GeneXpert MRSA Assay (FDA approved for NASAL specimens only), is one component of a comprehensive MRSA colonization surveillance program. It is not intended to diagnose MRSA infection nor to guide or monitor treatment for MRSA infections. Performed at [facility], [address], [place], [place] [address]     RADIOLOGY:  Dg Chest Portable 1 View  Result Date: 08/27/2018 CLINICAL DATA:  Nausea and vomiting.  Cough. EXAM: PORTABLE CHEST 1 VIEW COMPARISON:  None. FINDINGS: The heart is enlarged. There is mild prominence of the interstitial markings throughout both lung fields, RIGHT greater than LEFT, which could represent mild edema or early infiltrates. Calcified tortuous aorta. No effusion or pneumothorax. No frank consolidation. Skeletal osteopenia. IMPRESSION: Mild prominence interstitial markings throughout both lung fields, RIGHT greater than LEFT which could represent mild edema or early infiltrates. Continued surveillance is warranted. Electronically Signed   By: Elsie Stain M.D.   On: 08/27/2018 10:09    EKG:   Orders placed or performed during the hospital encounter of 08/12/18  . ED EKG  . ED EKG  . EKG 12-Lead  . EKG 12-Lead      Management plans discussed with the  family and they are in agreement.  CODE STATUS:     Code Status Orders  (From admission, onward)         Start     Ordered   08/27/18 1112  Do not attempt resuscitation (DNR)  Continuous    Question Answer Comment  In the event of cardiac or respiratory ARREST Do not call a "code blue"   In the  event of cardiac or respiratory ARREST Do not perform Intubation, CPR, defibrillation or ACLS   In the event of cardiac or respiratory ARREST Use medication by any route,  position, wound care, and other measures to relive pain and suffering. May use oxygen, suction and manual treatment of airway obstruction as needed for comfort.      08/27/18 1112        Code Status History    This patient has a current code status but no historical code status.    Advance Directive Documentation     Most Recent Value  Type of Advance Directive  Out of facility DNR (pink MOST or yellow form)  Pre-existing out of facility DNR order (yellow form or pink MOST form)  Yellow form placed in chart (order not valid for inpatient use)  "MOST" Form in Place?  -      TOTAL TIME TAKING CARE OF THIS PATIENT: 45  minutes.   Note: This dictation was prepared with Dragon dictation along with smaller phrase technology. Any transcriptional errors that result from this process are unintentional.   @  on 08/30/2018 at 12:42 PM  Between 7am to 6pm - Pager - [phone]  After 6pm go to [email or web address] - password [email or web address] [facility]  [place] [facility]  Office  [phone]  CC: Primary care physician; [facility]

## 2018-08-30 NOTE — Progress Notes (Signed)
Called and gave report to the receiving facility nurse at this time. All questions answered. Dorse Locy M Janet Decesare  Called [facility] and requested non-emergent patient transport to the receiving facility at this time. Awaiting arrival. Nichalas Coin M Alexei Doswell 

## 2018-08-30 NOTE — Discharge Instructions (Addendum)
Follow-up with primary care physician at the facility in 3 days Daily weight monitoring, intake and output Nausea and Vomiting, Adult Nausea is the feeling that you have an upset stomach or that you are about to vomit. Vomiting is when stomach contents are thrown up and out of the mouth as a result of nausea. Vomiting can make you feel weak and cause you to become dehydrated. Dehydration can make you feel tired and thirsty, cause you to have a dry mouth, and decrease how often you urinate. Older adults and people with other diseases or a weak disease-fighting system (immune system) are at higher risk for dehydration. It is important to treat your nausea and vomiting as told by your health care provider. Follow these instructions at home: Watch your symptoms for any changes. Tell your health care provider about them. Follow these instructions to care for yourself at home. Eating and drinking      Take an oral rehydration solution (ORS). This is a drink that is sold at pharmacies and retail stores.  Drink clear fluids slowly and in small amounts as you are able. Clear fluids include water, ice chips, low-calorie sports drinks, and fruit juice that has water added (diluted fruit juice).  Eat bland, easy-to-digest foods in small amounts as you are able. These foods include bananas, applesauce, rice, lean meats, toast, and crackers.  Avoid fluids that contain a lot of sugar or caffeine, such as energy drinks, sports drinks, and soda.  Avoid alcohol.  Avoid spicy or fatty foods. General instructions  Take over-the-counter and prescription medicines only as told by your health care provider.  Drink enough fluid to keep your urine pale yellow.  Wash your hands often using soap and water. If soap and water are not available, use hand sanitizer.  Make sure that all people in your household wash their hands well and often.  Rest at home while you recover.  Watch your condition for any  changes.  Breathe slowly and deeply when you feel nauseated.  Keep all follow-up visits as told by your health care provider. This is important. Contact a health care provider if:  Your symptoms get worse.  You have new symptoms.  You have a fever.  You cannot drink fluids without vomiting.  Your nausea does not go away after 2 days.  You feel light-headed or dizzy.  You have a headache.  You have muscle cramps.  You have a rash.  You have pain while urinating. Get help right away if:  You have pain in your chest, neck, arm, or jaw.  You feel extremely weak or you faint.  You have persistent vomiting.  You have vomit that is bright red or looks like black coffee grounds.  You have bloody or black stools or stools that look like tar.  You have a severe headache, a stiff neck, or both.  You have severe pain, cramping, or bloating in your abdomen.  You have difficulty breathing, or you are breathing very quickly.  Your heart is beating very quickly.  Your skin feels cold and clammy.  You feel confused.  You have signs of dehydration, such as: ? Dark urine, very little urine, or no urine. ? Cracked lips. ? Dry mouth. ? Sunken eyes. ? Sleepiness. ? Weakness. These symptoms may represent a serious problem that is an emergency. Do not wait to see if the symptoms will go away. Get medical help right away. Call your local emergency services (911 in the [place] Do not  drive yourself to the hospital. Summary  Nausea is the feeling that you have an upset stomach or that you are about to vomit. As nausea gets worse, it can lead to vomiting. Vomiting can make you feel weak and cause you to become dehydrated.  Follow instructions from your health care provider about eating and drinking to prevent dehydration.  Take over-the-counter and prescription medicines only as told by your health care provider.  Contact your health care provider if your symptoms get worse, or  you have new symptoms.  Keep all follow-up visits as told by your health care provider. This is important. This information is not intended to replace advice given to you by your health care provider. Make sure you discuss any questions you have with your health care provider. Document Released: 05/05/2005 Document Revised: 10/13/2017 Document Reviewed: 10/13/2017 Elsevier Interactive Patient Education  2019 [employer]

## 2018-08-30 NOTE — NC FL2 (Signed)
[place] MEDICAID FL2 LEVEL OF CARE SCREENING TOOL     IDENTIFICATION  Patient Name: Tamara SaxonJoyce N Boyland Birthdate: 07-05-1940 Sex: female Admission Date (Current Location): 08/27/2018  [place]   Facility and Address:  [facility], [address], [place], [place] [address]      Provider Number: [id number]  Attending Physician Name and Address:  Ramonita LabGouru, Aruna, MD  Relative Name and Phone Number:       Current Level of Care: Hospital Recommended Level of Care: Memory Care Prior Approval Number:    Date Approved/Denied:   PASRR Number:    Discharge Plan: Other (Comment)(Memory Care )    Current Diagnoses: Patient Active Problem List   Diagnosis Date Noted  . Sepsis (HCC) 08/27/2018    Orientation RESPIRATION BLADDER Height & Weight     Self  Normal Incontinent Weight: 145 lb (65.8 kg) Height:  5\' 5"  (165.1 cm)  BEHAVIORAL SYMPTOMS/MOOD NEUROLOGICAL BOWEL NUTRITION STATUS  (none) (none) Continent Diet(Soft diet )  AMBULATORY STATUS COMMUNICATION OF NEEDS Skin   Limited Assist Verbally Normal                       Personal Care Assistance Level of Assistance  Bathing, Feeding, Dressing Bathing Assistance: Limited assistance Feeding assistance: Independent Dressing Assistance: Limited assistance     Functional Limitations Info  Sight, Hearing, Speech Sight Info: Adequate Hearing Info: Adequate Speech Info: Adequate    SPECIAL CARE FACTORS FREQUENCY                       Contractures Contractures Info: Not present    Additional Factors Info   Code Status Info: DNR Allergies Info: NKA     Isolation Precautions Info: None patient tested negative for Covid 19     Current Medications (08/30/2018):  This is the current hospital active medication list Current Facility-Administered Medications  Medication Dose Route Frequency Provider Last Rate Last Dose  . 0.9 %  sodium chloride infusion    Intravenous PRN Gouru, Aruna, MD 10 mL/hr at 08/29/18 2028 500 mL at 08/29/18 2028  . acetaminophen (TYLENOL) tablet 650 mg  650 mg Oral Q6H PRN Milagros LollSudini, Srikar, MD       Or  . acetaminophen (TYLENOL) suppository 650 mg  650 mg Rectal Q6H PRN Sudini, Srikar, MD      . albuterol (PROVENTIL HFA;VENTOLIN HFA) 108 (90 Base) MCG/ACT inhaler 2 puff  2 puff Inhalation Q6H PRN Sudini, Wardell HeathSrikar, MD      . azithromycin (ZITHROMAX) 500 mg in sodium chloride 0.9 % 250 mL IVPB  500 mg Intravenous Q24H Gouru, Aruna, MD 250 mL/hr at 08/29/18 2029 500 mg at 08/29/18 2029  . cefTRIAXone (ROCEPHIN) 1 g in sodium chloride 0.9 % 100 mL IVPB  1 g Intravenous Q24H Milagros LollSudini, Srikar, MD 200 mL/hr at 08/30/18 0750 1 g at 08/30/18 0750  . enoxaparin (LOVENOX) injection 40 mg  40 mg Subcutaneous Q24H Milagros LollSudini, Srikar, MD   40 mg at 08/29/18 2021  . ondansetron (ZOFRAN) tablet 4 mg  4 mg Oral Q6H PRN Sudini, Wardell HeathSrikar, MD       Or  . ondansetron (ZOFRAN) injection 4 mg  4 mg Intravenous Q6H PRN Sudini, Srikar, MD      . polyethylene glycol (MIRALAX / GLYCOLAX) packet 17 g  17 g Oral Daily PRN Milagros LollSudini, Srikar, MD         Discharge Medications: TAKE these medications  acetaminophen 325 MG tablet Commonly known as:  TYLENOL Take 2 tablets (650 mg total) by mouth every 6 (six) hours as needed for mild pain (or Fever >/= 101).   albuterol 108 (90 Base) MCG/ACT inhaler Commonly known as:  PROVENTIL HFA;VENTOLIN HFA Inhale 2 puffs into the lungs every 6 (six) hours as needed for wheezing or shortness of breath.   alendronate 70 MG tablet Commonly known as:  FOSAMAX Take 70 mg by mouth every Sunday.   aspirin EC 81 MG tablet Take 81 mg by mouth daily.   atorvastatin 40 MG tablet Commonly known as:  LIPITOR Take 40 mg by mouth daily.   azithromycin 250 MG tablet Commonly known as:  Zithromax Z-Pak Take 2 tablets (500 mg) on  Day 1,  followed by 1 tablet (250 mg) once daily on Days 2 through 5. Start taking on:  August 31, 2018   cefdinir 300 MG capsule Commonly known as:  OMNICEF Take 1 capsule (300 mg total) by mouth 2 (two) times daily.   memantine 10 MG tablet Commonly known as:  NAMENDA Take 10 mg by mouth 2 (two) times daily.   metoprolol tartrate 50 MG tablet Commonly known as:  LOPRESSOR Take 0.5 tablets (25 mg total) by mouth 2 (two) times daily. What changed:  how much to take   Multi Vitamin Daily Tabs Take 1 tablet by mouth daily.   rivastigmine 3 MG capsule Commonly known as:  EXELON Take 3 mg by mouth 2 (two) times daily.   sertraline 50 MG tablet Commonly known as:  ZOLOFT Take 75 mg by mouth daily.   traZODone 50 MG tablet Commonly known as:  DESYREL Take 50 mg by mouth at bedtime.   vitamin C 1000 MG tablet Take 1,000 mg by mouth daily.   Vitamin D3 50 MCG (2000 UT) capsule Take 2,000 Units by mouth daily.     Relevant Imaging Results:  Relevant Lab Results:   Additional Information    Daleen Steinhaus, Ervin Knack, LCSW

## 2018-08-30 NOTE — TOC Progression Note (Addendum)
Transition of Care [facility] - Progression Note    Patient Details  Name: Tamara Stevenson MRN: [id number] Date of Birth: [birth year]  Transition of Care [facility] CM/SW Contact  Darleene Cleaver, [place] Phone Number: 08/30/2018, 11:21 AM  Clinical Narrative:    CSW spoke to [place] at [facility], and informed her that patient's Covid results came back negative, and physician would like to discharge today if she is medically ready for discharge.  Per Noreene Larsson she would like the [facility] and discharge summary faxed to [phone] so she can review to make sure patient is able to return today, awaiting for a call back.  1:45pm  CSW faxed requested clinical information to [place] at [facility].  3:00pm  Left a message on voice mail for Noreene Larsson at [facility] asking if she has been able to review FL2 and discharge summary to make sure patient can discharge today.  3:15pm  CSW received phone call from [place], that patient is able to return back today.  CSW updated patient's daughter Waynetta Sandy, [phone].  Patient will be discharging back to ALF today.   Expected Discharge Plan: Memory Care Barriers to Discharge: Continued Medical Work up  Expected Discharge Plan and Services Expected Discharge Plan: Memory Care       Living arrangements for the past 2 months: Assisted Living Facility Expected Discharge Date: 08/30/18                         Social Determinants of Health (SDOH) Interventions    Readmission Risk Interventions No flowsheet data found.

## 2018-08-30 NOTE — Care Management Important Message (Signed)
Important Message  Patient Details  Name: GENNIEVE ELPERS MRN: [id number] Date of Birth: 03-07-1941   Medicare Important Message Given:  Yes  Patient unable to sign initial Medicare IM due to baseline dementia.  Reviewed verbally with Rockney Ghee, daughter, at [phone].  Copy of Medicare IM to be sent securely to daughter's email: [email or web address].   Johnell Comings 08/30/2018, 12:16 PM

## 2018-09-01 LAB — CULTURE, BLOOD (ROUTINE X 2)
Culture: NO GROWTH
Culture: NO GROWTH
Special Requests: ADEQUATE
Special Requests: ADEQUATE

## 2019-02-13 ENCOUNTER — Emergency Department: Payer: Medicare Other

## 2019-02-13 ENCOUNTER — Emergency Department
Admission: EM | Admit: 2019-02-13 | Discharge: 2019-02-14 | Disposition: A | Discharge status: Home / Self Care | Payer: Medicare Other | Attending: Emergency Medicine | Admitting: Emergency Medicine

## 2019-02-13 ENCOUNTER — Other Ambulatory Visit: Payer: Self-pay

## 2019-02-13 DIAGNOSIS — I509 Heart failure, unspecified: Secondary | ICD-10-CM | POA: Insufficient documentation

## 2019-02-13 DIAGNOSIS — Z8673 Personal history of transient ischemic attack (TIA), and cerebral infarction without residual deficits: Secondary | ICD-10-CM | POA: Insufficient documentation

## 2019-02-13 DIAGNOSIS — W06XXXA Fall from bed, initial encounter: Secondary | ICD-10-CM | POA: Diagnosis not present

## 2019-02-13 DIAGNOSIS — Y92122 Bedroom in nursing home as the place of occurrence of the external cause: Secondary | ICD-10-CM | POA: Insufficient documentation

## 2019-02-13 DIAGNOSIS — Z7982 Long term (current) use of aspirin: Secondary | ICD-10-CM | POA: Diagnosis not present

## 2019-02-13 DIAGNOSIS — I110 Hypertensive heart disease with heart failure: Secondary | ICD-10-CM | POA: Diagnosis not present

## 2019-02-13 DIAGNOSIS — F0390 Unspecified dementia without behavioral disturbance: Secondary | ICD-10-CM | POA: Diagnosis present

## 2019-02-13 DIAGNOSIS — Y998 Other external cause status: Secondary | ICD-10-CM | POA: Insufficient documentation

## 2019-02-13 DIAGNOSIS — Y9389 Activity, other specified: Secondary | ICD-10-CM | POA: Diagnosis not present

## 2019-02-13 DIAGNOSIS — Z79899 Other long term (current) drug therapy: Secondary | ICD-10-CM | POA: Insufficient documentation

## 2019-02-13 DIAGNOSIS — W19XXXA Unspecified fall, initial encounter: Secondary | ICD-10-CM

## 2019-02-13 NOTE — ED Triage Notes (Addendum)
Pt to ED via EMS from [facility]. Pt arrives post fall from bed. Pt has hx of dementia, states she does not recall the fall. Complaining of pain and numbness all over. Pt appears in no acute distress. VSS. Pt oriented to self only

## 2019-02-14 NOTE — ED Provider Notes (Signed)
[facility] Emergency Department Provider Note  ____________________________________________   First MD Initiated Contact with Patient 02/13/19 2320     (approximate)  I have reviewed the triage vital signs and the nursing notes.   HISTORY  Chief Complaint Fall  Level 5 caveat:  history/ROS limited by chronic dementia  HPI Tamara Stevenson is a 78 y.o. female with medical history as listed below which notably includes advanced dementia who presents for evaluation after a fall.  Reportedly she fell from her bed.  There is no obvious signs of trauma but she was sent here from [facility] given their policy on falls.  She is in no acute distress but is not able to provide any history.  She knows her name and that she is at a hospital but otherwise is unable to provide any detail.  I asked her if she is hurting and she said yes but is unable to tell me what hurts or where it hurts.         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 Authorizing Provider  acetaminophen (TYLENOL) 325 MG tablet Take 2 tablets (650 mg total) by mouth every 6 (six) hours as needed for mild pain (or Fever >/= 101). 08/30/18   Gouru, Deanna ArtisAruna, MD  albuterol (PROVENTIL HFA;VENTOLIN HFA) 108 (90 Base) MCG/ACT inhaler Inhale 2 puffs into the lungs every 6 (six) hours as needed for wheezing or shortness of breath. 08/30/18   Ramonita LabGouru, Aruna, MD  alendronate (FOSAMAX) 70 MG tablet Take 70 mg by mouth every Sunday.  07/03/18   [provider]  Ascorbic Acid (VITAMIN  C) 1000 MG tablet Take 1,000 mg by mouth daily.    [provider]  aspirin EC 81 MG tablet Take 81 mg by mouth daily.    [provider]  atorvastatin (LIPITOR) 40 MG tablet Take 40 mg by mouth daily.  06/05/18   [provider]  cefdinir (OMNICEF) 300 MG capsule Take 1 capsule (300 mg total) by mouth 2 (two) times daily. 08/30/18   Ramonita LabGouru, Aruna, MD  Cholecalciferol (VITAMIN D3) 50 MCG (2000 UT) capsule Take 2,000 Units by mouth daily.    [provider]  memantine (NAMENDA) 10 MG tablet Take 10 mg by mouth 2 (two) times daily.  07/05/18   [provider]  metoprolol tartrate (LOPRESSOR) 50 MG tablet Take 0.5 tablets (25 mg total) by mouth 2 (two) times daily. 08/30/18   Ramonita LabGouru, Aruna, MD  Multiple Vitamin (MULTI VITAMIN DAILY) TABS Take 1 tablet by mouth daily.    [provider]  rivastigmine (EXELON) 3 MG capsule Take 3 mg by mouth 2 (two) times daily. 07/05/18   [provider]  sertraline (ZOLOFT) 50 MG tablet Take 75 mg by mouth daily.  06/05/18   [provider]  traZODone (DESYREL) 50 MG tablet Take 50 mg by mouth at bedtime.    [provider]    Allergies Patient has no known allergies.  No family history  on file.  Social History Social History   Tobacco Use   Smoking status: Never Smoker   Smokeless tobacco: Never Used  Substance Use Topics   Alcohol use: Never    Frequency: Never   Drug use: Never    Review of Systems Level 5 caveat:  history/ROS limited by chronic dementia   ____________________________________________   PHYSICAL EXAM:  VITAL SIGNS: ED Triage Vitals  Enc Vitals Group     BP 02/13/19 2301 124/63     Pulse Rate 02/13/19 2301 64     Resp 02/13/19 2301 16     Temp 02/13/19 2308 98.2 F (36.8 C)     Temp Source 02/13/19 2308 Oral     SpO2 02/13/19 2301 97 %     Weight 02/13/19 2302 68 kg (150 lb)     Height 02/13/19 2302 1.702 m (5\' 7" )     Head Circumference --        Peak Flow --      Pain Score 02/13/19 2302 10     Pain Loc --      Pain Edu? --      Excl. in [place]? --     Constitutional: Alert and oriented to self and location.  Confused at baseline.  No acute distress. Eyes: Conjunctivae are normal.  Head: Atraumatic. Nose: No congestion/rhinnorhea. Mouth/Throat: Mucous membranes are moist. Neck: No stridor.  No meningeal signs.   Cardiovascular: Normal rate, regular rhythm. Good peripheral circulation. Grossly normal heart sounds. Respiratory: Normal respiratory effort.  No retractions. Gastrointestinal: Soft and nontender. No distention.  Musculoskeletal: No tenderness to palpation along the cervical spine.  She is flexing and extending and rotating her head side to side without any apparent pain.  No lower extremity tenderness nor edema. No gross deformities of extremities.  I passively ranged her hips and knees as well as her shoulders and elbows and no pain was reproduced and the patient did not indicate any pain, withdraw, or wince at any of the activities.  She continued to talk to me throughout my physical exam. Neurologic:  Normal speech and language. No gross focal neurologic deficits are appreciated.  Skin:  Skin is warm, dry and intact.   ____________________________________________   LABS (all labs ordered are listed, but only abnormal results are displayed)  Labs Reviewed - No data to display ____________________________________________  EKG  None - EKG not ordered by ED physician ____________________________________________  RADIOLOGY I, Hinda Kehr, personally viewed and evaluated these images (plain radiographs) as part of my medical decision making, as well as reviewing the written report by the radiologist.  ED MD interpretation: No acute abnormalities identified on CT head nor CT cervical spine  Official radiology report(s): Ct Head Wo Contrast  Result Date: 02/14/2019 CLINICAL DATA:  Fall from bed. Pain and  numbness. EXAM: CT HEAD WITHOUT CONTRAST CT CERVICAL SPINE WITHOUT CONTRAST TECHNIQUE: Multidetector CT imaging of the head and cervical spine was performed following the standard protocol without intravenous contrast. Multiplanar CT image reconstructions of the cervical spine were also generated. COMPARISON:  Head CT 07/06/2018, head and cervical spine CT 06/09/2018 FINDINGS: CT HEAD FINDINGS Brain: No intracranial hemorrhage, mass effect, or midline shift. Stable atrophy and chronic small vessel ischemia. No hydrocephalus. The basilar cisterns are patent. No evidence of territorial infarct or acute ischemia. No extra-axial or intracranial fluid collection. Vascular: Atherosclerosis of skullbase vasculature without hyperdense vessel or abnormal calcification. Skull: No fracture or focal lesion. Sinuses/Orbits: No acute finding. Other: None. CT CERVICAL  SPINE FINDINGS Alignment: Trace degenerative anterolisthesis of C7 on T1. No traumatic subluxation. Skull base and vertebrae: No acute fracture. Vertebral body heights are maintained. The dens and skull base are intact. Soft tissues and spinal canal: No prevertebral fluid or swelling. No visible canal hematoma. Disc levels: Advanced multilevel degenerative disc disease with disc space narrowing and endplate spurring. Multilevel facet hypertrophy. Partial ankylosis of C3-C4 posterior elements may be congenital or degenerative. Upper chest: No acute findings. Other: None. IMPRESSION: 1. No acute intracranial abnormality. No skull fracture. Stable atrophy and chronic small vessel ischemia. 2. Multilevel degenerative change in the cervical spine without acute fracture or subluxation. Electronically Signed   By: Narda Rutherford M.D.   On: 02/14/2019 00:14   Ct Cervical Spine Wo Contrast  Result Date: 02/14/2019 CLINICAL DATA:  Fall from bed. Pain and numbness. EXAM: CT HEAD WITHOUT CONTRAST CT CERVICAL SPINE WITHOUT CONTRAST TECHNIQUE: Multidetector CT imaging of the  head and cervical spine was performed following the standard protocol without intravenous contrast. Multiplanar CT image reconstructions of the cervical spine were also generated. COMPARISON:  Head CT 07/06/2018, head and cervical spine CT 06/09/2018 FINDINGS: CT HEAD FINDINGS Brain: No intracranial hemorrhage, mass effect, or midline shift. Stable atrophy and chronic small vessel ischemia. No hydrocephalus. The basilar cisterns are patent. No evidence of territorial infarct or acute ischemia. No extra-axial or intracranial fluid collection. Vascular: Atherosclerosis of skullbase vasculature without hyperdense vessel or abnormal calcification. Skull: No fracture or focal lesion. Sinuses/Orbits: No acute finding. Other: None. CT CERVICAL SPINE FINDINGS Alignment: Trace degenerative anterolisthesis of C7 on T1. No traumatic subluxation. Skull base and vertebrae: No acute fracture. Vertebral body heights are maintained. The dens and skull base are intact. Soft tissues and spinal canal: No prevertebral fluid or swelling. No visible canal hematoma. Disc levels: Advanced multilevel degenerative disc disease with disc space narrowing and endplate spurring. Multilevel facet hypertrophy. Partial ankylosis of C3-C4 posterior elements may be congenital or degenerative. Upper chest: No acute findings. Other: None. IMPRESSION: 1. No acute intracranial abnormality. No skull fracture. Stable atrophy and chronic small vessel ischemia. 2. Multilevel degenerative change in the cervical spine without acute fracture or subluxation. Electronically Signed   By: Narda Rutherford M.D.   On: 02/14/2019 00:14    ____________________________________________   PROCEDURES   Procedure(s) performed (including Critical Care):  Procedures   ____________________________________________   INITIAL IMPRESSION / MDM / ASSESSMENT AND PLAN / ED COURSE  As part of my medical decision making, I reviewed the following data within the  electronic MEDICAL RECORD NUMBER Nursing notes reviewed and incorporated, Old chart reviewed and Notes from prior ED visits   Differential diagnosis includes, but is not limited to, mechanical fall, acute intracranial bleeding, cervical spine injury, injury to extremities, acute infection.  The patient is afebrile, not tachycardic, and has reassuring vital signs.  She is in no acute distress lying in bed and seems to be at her baseline mental status.  Given the history of a fall I ordered a CT head and cervical spine but they were reassuring with no sign of acute injury.  Her physical exam was also reassuring with no pain elicited and no evidence of acute injury.  I will discharge back to her facility.          ____________________________________________  FINAL CLINICAL IMPRESSION(S) / ED DIAGNOSES  Final diagnoses:  Fall, initial encounter     MEDICATIONS GIVEN DURING THIS VISIT:  Medications - No data to display   ED Discharge  Orders    None      *Please note:  Tamara Stevenson was evaluated in Emergency Department on 02/14/2019 for the symptoms described in the history of present illness. She was evaluated in the context of the global COVID-19 pandemic, which necessitated consideration that the patient might be at risk for infection with the SARS-CoV-2 virus that causes COVID-19. Institutional protocols and algorithms that pertain to the evaluation of patients at risk for COVID-19 are in a state of rapid change based on information released by regulatory bodies including the CDC and federal and state organizations. These policies and algorithms were followed during the patient's care in the ED.  Some ED evaluations and interventions may be delayed as a result of limited staffing during the pandemic.*  Note:  This document was prepared using Dragon voice recognition software and may include unintentional dictation errors.   Loleta Rose, MD 02/14/19 0030

## 2019-02-14 NOTE — Discharge Instructions (Signed)

## 2019-08-03 IMAGING — CT CT HEAD W/O CM
4 series · 16 of 47 positions shown, 18 images · non-contrast
Comparison: 06/09/2018

CLINICAL DATA: Minor head trauma.

EXAM:
CT HEAD WITHOUT CONTRAST
TECHNIQUE: Contiguous axial images were obtained from the base of the skull
through the vertex without intravenous contrast.

[Series 2: head bone · axial · 0.43mm/px · z∈[-126,-94]mm · 3 of 79 slices shown]
[im 8/79  bone]
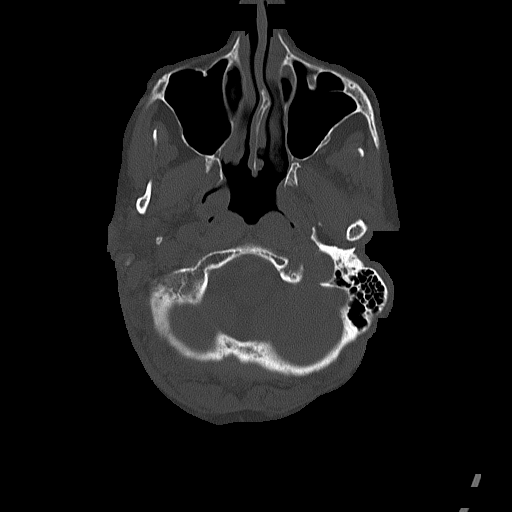
[im 16/79  bone]
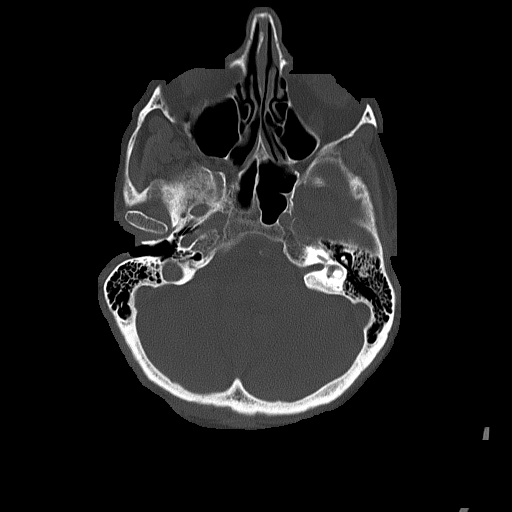
[im 24/79  bone]
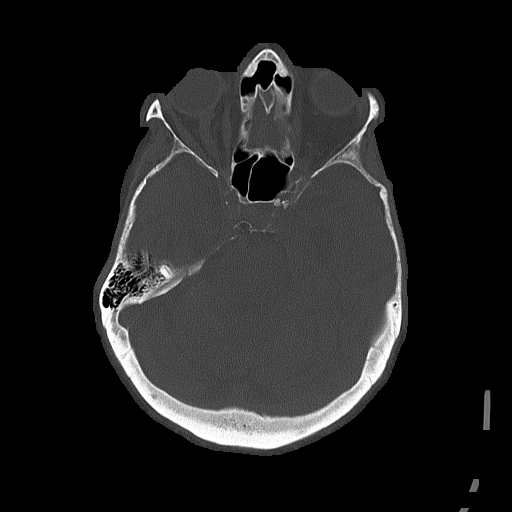

[Series 3: head wo · axial · 0.43mm/px · z∈[-125,-5]mm · 7 of 32 slices shown, 9 images]
[im 4/32  brain]
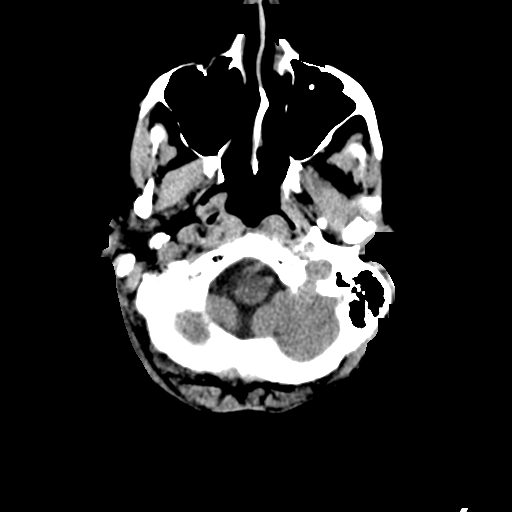
[im 4/32  bone]
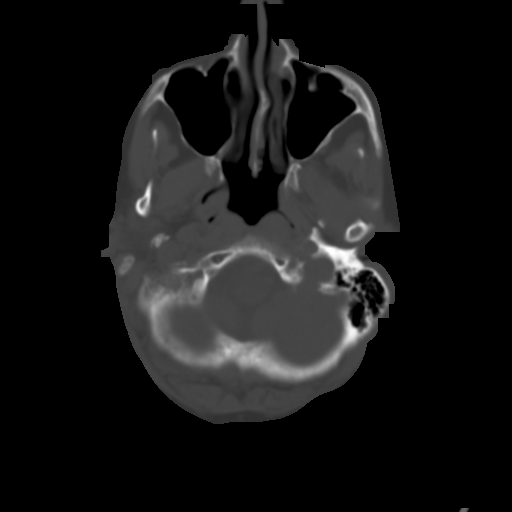
[im 8/32  brain]
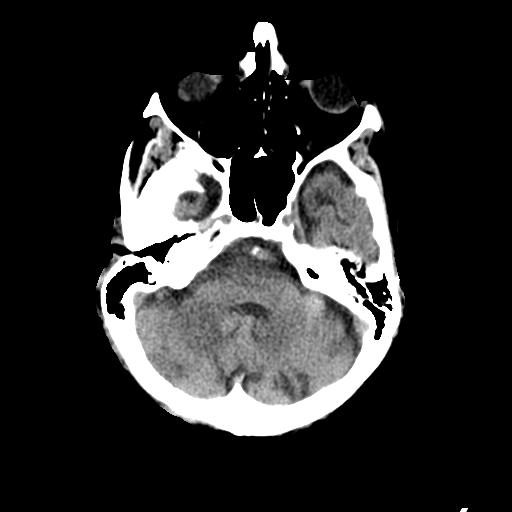
[im 12/32  brain]
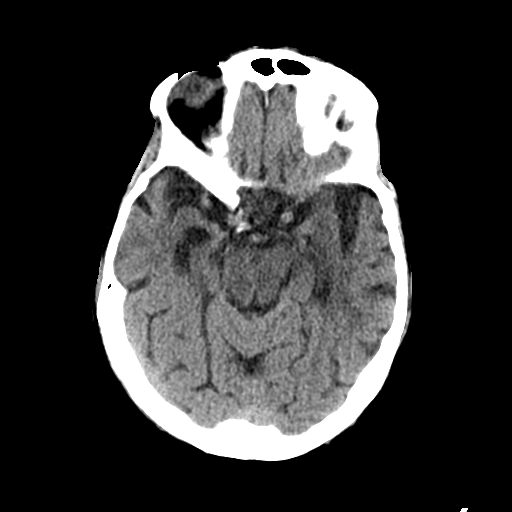
[im 16/32  brain]
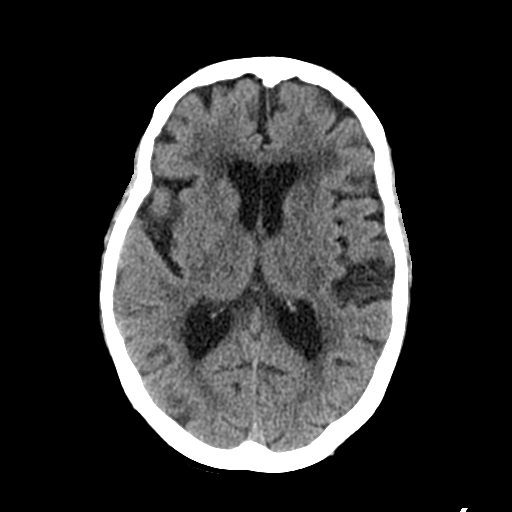
[im 20/32  brain]
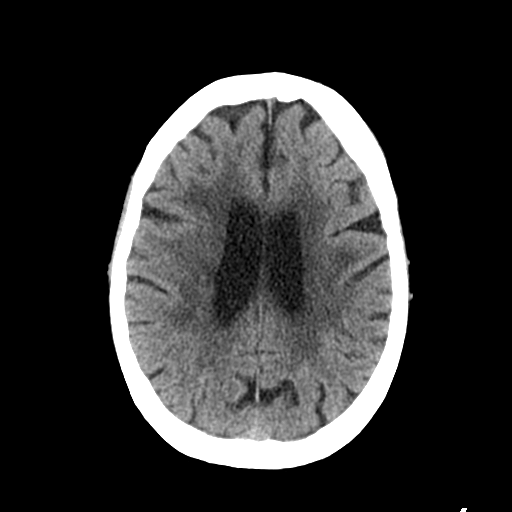
[im 20/32  bone]
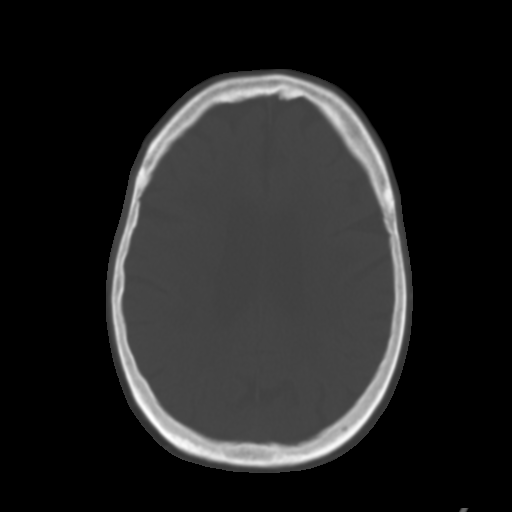
[im 24/32  brain]
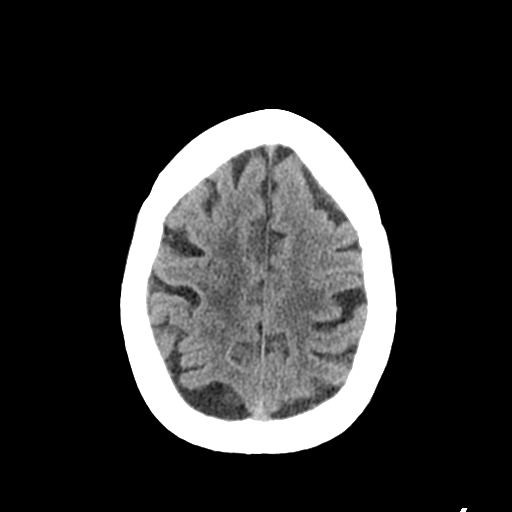
[im 28/32  brain]
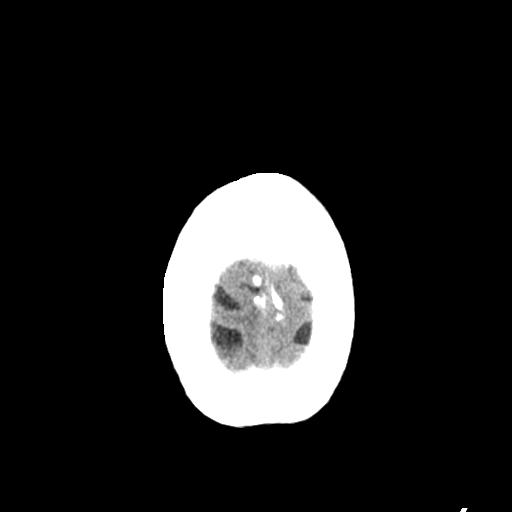

[Series 4: coronal soft tissue · coronal · 0.32mm/px · 3 of 69 slices shown]
[im 23/69  brain]
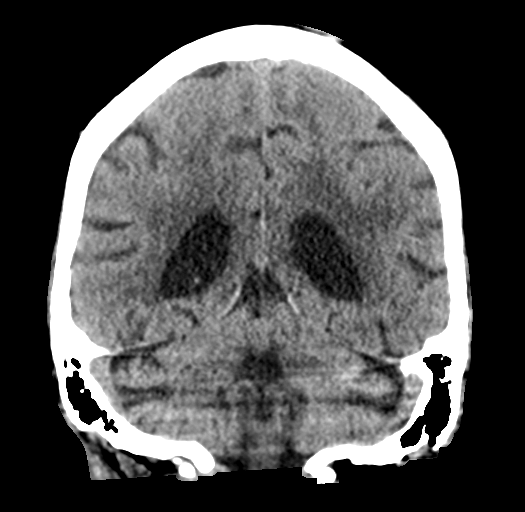
[im 31/69  brain]
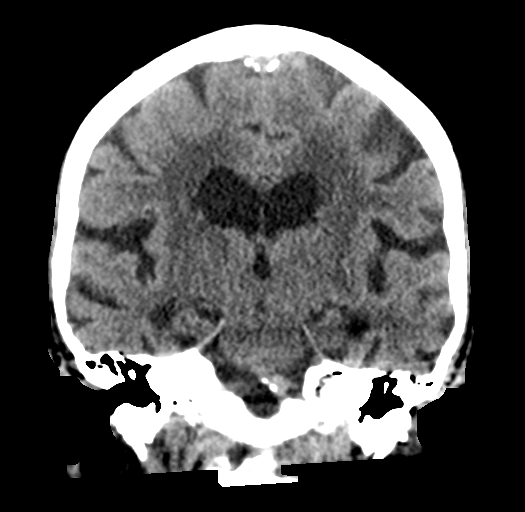
[im 38/69  brain]
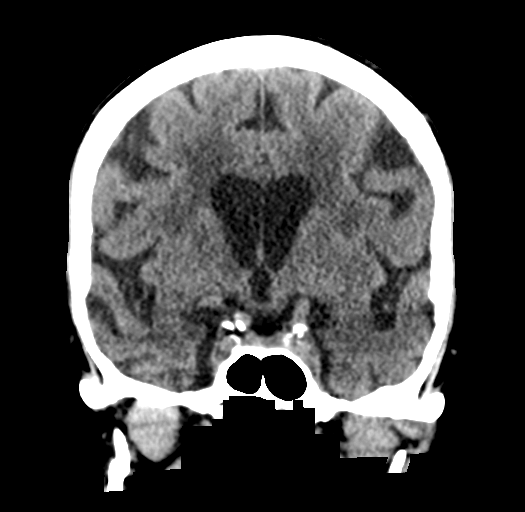

[Series 5: sagittal soft tissue · sagittal · 0.33mm/px · 3 of 49 slices shown]
[im 17/49  brain]
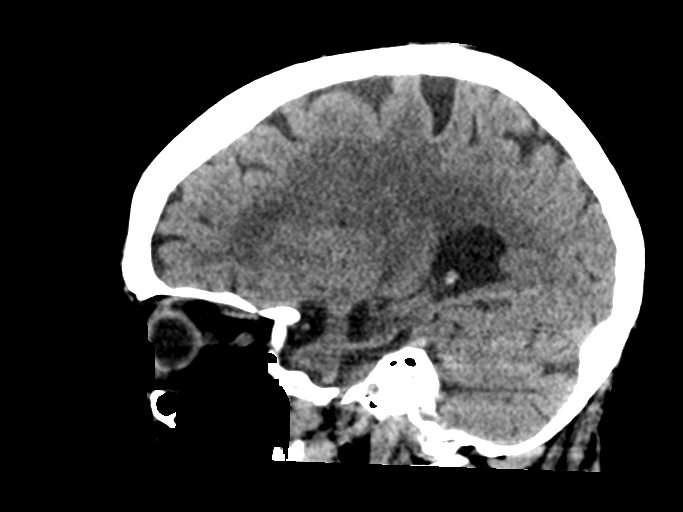
[im 25/49  brain]
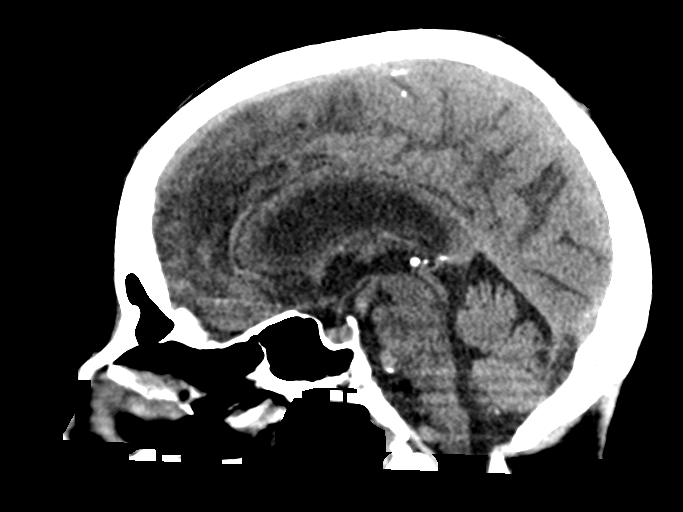
[im 33/49  brain]
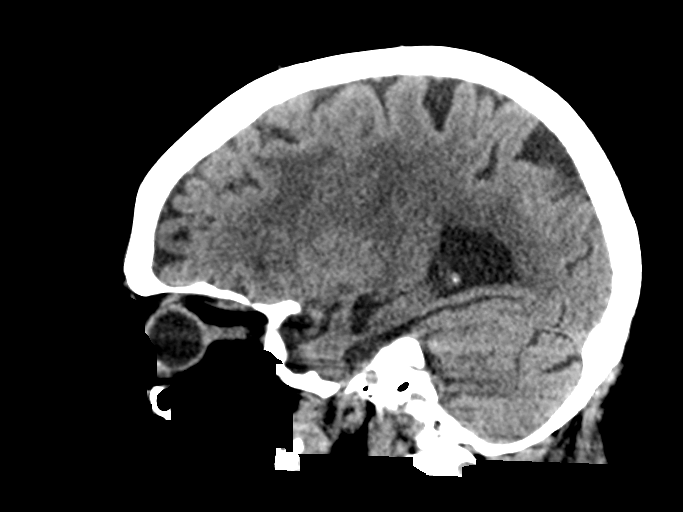

[16 of 47 positions shown; findings below may reference images not displayed]

FINDINGS: Brain: No evidence of acute infarction, hemorrhage, hydrocephalus,
extra-axial collection or mass lesion/mass effect. Extensive chronic
ischemic injury with confluent gliosis in the cerebral white matter
and chronic ischemic injury in the deep gray nuclei and right corona
radiata. Small remote left cerebellar infarcts. Medial temporal
predominant volume loss correlating with history of Alzheimer's
dementia.

Vascular: Atherosclerotic calcification

Skull:   Negative for fracture

Sinuses/Orbits: No evidence of injury.
IMPRESSION: 1. No acute finding.
2. Atrophy and chronic small vessel ischemia.

## 2019-08-14 ENCOUNTER — Encounter: Payer: Self-pay | Admitting: Emergency Medicine

## 2019-08-14 ENCOUNTER — Other Ambulatory Visit: Payer: Self-pay

## 2019-08-14 ENCOUNTER — Emergency Department
Admission: EM | Admit: 2019-08-14 | Discharge: 2019-08-14 | Disposition: A | Discharge status: Nursing Home (Includes ICF) | Payer: Medicare Other | Attending: Emergency Medicine | Admitting: Emergency Medicine

## 2019-08-14 DIAGNOSIS — Z79899 Other long term (current) drug therapy: Secondary | ICD-10-CM | POA: Diagnosis not present

## 2019-08-14 DIAGNOSIS — I509 Heart failure, unspecified: Secondary | ICD-10-CM | POA: Diagnosis not present

## 2019-08-14 DIAGNOSIS — F0390 Unspecified dementia without behavioral disturbance: Secondary | ICD-10-CM | POA: Diagnosis not present

## 2019-08-14 DIAGNOSIS — I110 Hypertensive heart disease with heart failure: Secondary | ICD-10-CM | POA: Insufficient documentation

## 2019-08-14 DIAGNOSIS — I2510 Atherosclerotic heart disease of native coronary artery without angina pectoris: Secondary | ICD-10-CM | POA: Diagnosis not present

## 2019-08-14 DIAGNOSIS — R404 Transient alteration of awareness: Secondary | ICD-10-CM | POA: Diagnosis not present

## 2019-08-14 DIAGNOSIS — R4182 Altered mental status, unspecified: Secondary | ICD-10-CM | POA: Diagnosis present

## 2019-08-14 NOTE — ED Notes (Signed)
Daughter Rockney Ghee called and spoke with this RN regarding an update on her mother. Ms. Tamara Stevenson was updated on her mothers current condition and stability and encouraged to please call back for any other questions or concerns

## 2019-08-14 NOTE — ED Provider Notes (Signed)
[facility] Emergency Department Provider Note  ____________________________________________   First MD Initiated Contact with Patient 08/14/19 0201     (approximate)  I have reviewed the triage vital signs and the nursing notes.   HISTORY  Chief Complaint Altered Mental Status  Level 5 caveat:  history/ROS limited by chronic dementia  HPI Tamara Stevenson is a 79 y.o. female with medical history as listed below who presents by EMS for evaluation of decreased level of consciousness.  Reportedly she had a normal day yesterday and then was given her evening medications including her  trazodone.  [facility] staff members then checked on her to help her to the bathroom and described her as "unresponsive".  They called EMS and when EMS arrived she is clearly somnolent but will awaken and talk with paramedics and with [place].  She denies any acute complaints at this time.  She denies shortness of breath and abdominal pain.        Past Medical History:  Diagnosis Date  . Advanced dementia ([place])    likely Alzheimers  . CHF (congestive heart failure) ([place])   . Coronary artery disease   . Hyperlipidemia   . Hypertension   . Stroke [facility])     Patient Active Problem List   Diagnosis Date Noted  . Sepsis ([place]) 08/27/2018    Past Surgical History:  Procedure Laterality Date  . CARDIAC CATHETERIZATION  01/27/2011    Prior to Admission medications   Medication Sig Start Date End Date Taking? Authorizing Provider  acetaminophen (TYLENOL) 325 MG tablet Take 2 tablets (650 mg total) by mouth every 6 (six) hours as needed for mild pain (or Fever >/= 101). 08/30/18   Gouru, Illene Silver, MD  albuterol (PROVENTIL HFA;VENTOLIN HFA) 108 (90 Base) MCG/ACT inhaler Inhale 2 puffs into the lungs every 6 (six) hours as needed for wheezing or shortness of breath. 08/30/18   Nicholes Mango, MD  alendronate (FOSAMAX) 70 MG tablet Take 70 mg by mouth every Sunday.  07/03/18   [provider]  Ascorbic Acid (VITAMIN C) 1000 MG tablet Take 1,000 mg by mouth daily.    [provider]  aspirin EC 81 MG tablet Take 81 mg by mouth daily.    [provider]  atorvastatin (LIPITOR) 40 MG tablet Take 40 mg by mouth daily.  06/05/18   [provider]  cefdinir (OMNICEF) 300 MG capsule Take 1 capsule (300 mg total) by mouth 2 (two) times daily. 08/30/18   Nicholes Mango, MD  Cholecalciferol (VITAMIN D3) 50 MCG (2000 UT) capsule Take 2,000 Units by mouth daily.    [provider]  memantine (NAMENDA) 10 MG tablet Take 10 mg by mouth 2 (two) times daily.  07/05/18   [provider]  metoprolol tartrate (LOPRESSOR) 50 MG tablet Take 0.5 tablets (25 mg total) by mouth 2 (two) times daily. 08/30/18   Nicholes Mango, MD  Multiple Vitamin (MULTI VITAMIN DAILY) TABS Take 1 tablet by mouth daily.    [provider]  rivastigmine (EXELON) 3 MG capsule Take 3 mg by mouth 2 (two) times daily. 07/05/18   [provider]  sertraline (ZOLOFT) 50 MG tablet Take 75 mg by mouth daily.  06/05/18   [provider]  traZODone (DESYREL) 50 MG tablet Take 50 mg by mouth at bedtime.    [provider]    Allergies Patient has no known allergies.  History reviewed. No pertinent family history.  Social History Social History  Tobacco Use  . Smoking status: Never Smoker  . Smokeless tobacco: Never Used  Substance Use Topics  . Alcohol use: Never  . Drug use: Never    Review of Systems Level 5 caveat:  history/ROS limited by chronic dementia   ____________________________________________   PHYSICAL EXAM:  ED Triage Vitals  Enc Vitals Group     BP 08/14/19 0219 (!) 144/56     Pulse Rate 08/14/19 0219 (!) 52     Resp 08/14/19 0219 16     Temp 08/14/19 0219 97.6 F (36.4 C)     Temp Source 08/14/19 0219 Oral     SpO2 08/14/19 0201 98 %     Weight 08/14/19 0211 54.7 kg (120 lb 9.6 oz)     Height 08/14/19 0211  1.549 m (5\' 1" )     Head Circumference --      Peak Flow --      Pain Score 08/14/19 0219 0     Pain Loc --      Pain Edu? --      Excl. in [place]? --     Constitutional: Somnolent but awakens to voice and light touch.  No acute distress. Eyes: Conjunctivae are normal.  Head: Atraumatic. Nose: No congestion/rhinnorhea. Mouth/Throat: Patient is wearing a mask. Neck: No stridor.  No meningeal signs.   Cardiovascular: Normal rate, regular rhythm. Good peripheral circulation. Grossly normal heart sounds. Respiratory: Normal respiratory effort.  No retractions. Gastrointestinal: Soft and nontender. No distention.  Musculoskeletal: No lower extremity tenderness nor edema. No gross deformities of extremities. Neurologic:  Normal speech and language. No gross focal neurologic deficits are appreciated. GCS 13 (Eye 3, Verbal 4, Motor 6) with dementia at baseline. Skin:  Skin is warm, dry and intact.   ____________________________________________   LABS (all labs ordered are listed, but only abnormal results are displayed)  Labs Reviewed - No data to display ____________________________________________  EKG  ED ECG REPORT I, 08/16/19, the attending physician, personally viewed and interpreted this ECG.  Date: 08/14/2019 EKG Time: 2:24 AM Rate: 53 Rhythm: normal sinus rhythm QRS Axis: normal Intervals: normal ST/T Wave abnormalities: Non-specific ST segment / T-wave changes, but no clear evidence of acute ischemia. Narrative Interpretation: no definitive evidence of acute ischemia; does not meet STEMI criteria.   ____________________________________________  RADIOLOGY I, 08/16/2019, personally viewed and evaluated these images (plain radiographs) as part of my medical decision making, as well as reviewing the written report by the radiologist.  ED MD interpretation: No indication for emergent imaging  Official radiology report(s): No results  found.  ____________________________________________   PROCEDURES   Procedure(s) performed (including Critical Care):  Procedures   ____________________________________________   INITIAL IMPRESSION / MDM / ASSESSMENT AND PLAN / ED COURSE  As part of my medical decision making, I reviewed the following data within the electronic MEDICAL RECORD NUMBER Nursing notes reviewed and incorporated, EKG interpreted , Old chart reviewed and Notes from prior ED visits   Differential diagnosis includes, but is not limited to, medication/drug side effect, acute infection, cardiac abnormality, electrolyte or metabolic abnormality.  The patient presents in no acute distress and seems to be somnolent likely due to the time of night (it is about 2 AM) and the fact that she had her evening medications.  Her vital signs are stable and she is afebrile.  At this point I do not feel that there is any indication for invasive procedures including lab work or in and out catheterization for urine.  I have requested an EKG and will monitor the patient for short period of time but I think mostly she just needs some sleep.  I have requested that she be put on a cardiac monitor for continuous evaluation of possible arrhythmia and on the pulse oximeter but if her vital signs remain within normal limits and she has no acute events she should be discharged back to her facility.      Clinical Course as of Aug 13 649  Sun Aug 14, 2019  [id number] Patient's vital signs have been stable for about 5 hours.  She is easily arousable to relatively light stimulation.  Sarah RN talked with patient's daughter and let her know there is no evidence of acute/emergent medical condition at this time.  We will discharge back to her facility.   [CF]    Clinical Course User Index [CF] Loleta Rose, MD     ____________________________________________  FINAL CLINICAL IMPRESSION(S) / ED DIAGNOSES  Final diagnoses:  Transient alteration of  awareness  Dementia without behavioral disturbance, unspecified dementia type (HCC)     MEDICATIONS GIVEN DURING THIS VISIT:  Medications - No data to display   ED Discharge Orders    None      *Please note:  KC SEDLAK was evaluated in Emergency Department on 08/14/2019 for the symptoms described in the history of present illness. She was evaluated in the context of the global COVID-19 pandemic, which necessitated consideration that the patient might be at risk for infection with the SARS-CoV-2 virus that causes COVID-19. Institutional protocols and algorithms that pertain to the evaluation of patients at risk for COVID-19 are in a state of rapid change based on information released by regulatory bodies including the CDC and federal and state organizations. These policies and algorithms were followed during the patient's care in the ED.  Some ED evaluations and interventions may be delayed as a result of limited staffing during the pandemic.*  Note:  This document was prepared using Dragon voice recognition software and may include unintentional dictation errors.   Loleta Rose, MD 08/14/19 [phone]

## 2019-08-14 NOTE — ED Notes (Signed)
Pt unable to sign for discharge d/t dementia status

## 2019-08-14 NOTE — ED Triage Notes (Addendum)
Pt from [facility] and brought in by [facility] for "suscpected unresponsiveness". Per facility pt was given her night time meds (Trazdone) and after being left in the bathroom for an unknown amount of time pt became unresponsive. Pt has dementia at baseline.   Per EMS report when they arrived on scene pt was responded to voice and was alert with her eyes open. Pt currently sleeping intermittently but able to arouse and wake up as needed.   VS Stable on the monitor at this time.

## 2019-08-14 NOTE — Discharge Instructions (Addendum)
Tamara Stevenson has been stable overnight with no indication of an acute or emergent medical condition.  She may have been having a side effect to one of her medications, such as her trazodone.  Please continue her regular medications for now and follow-up with her primary care doctor at the next available opportunity.  Return to the emergency department if you develop new or worsening symptoms.

## 2019-08-14 NOTE — ED Notes (Signed)
Pt lying in bed asleep (eyes closed with equal unlabored RR). VS WNL. Nothing needed from staff at this tiem

## 2019-08-14 NOTE — ED Notes (Signed)
Called [facility] for transport to [facility]

## 2019-08-18 ENCOUNTER — Emergency Department
Admission: EM | Admit: 2019-08-18 | Discharge: 2019-08-18 | Disposition: A | Discharge status: Home / Self Care | Payer: Medicare Other | Attending: Emergency Medicine | Admitting: Emergency Medicine

## 2019-08-18 ENCOUNTER — Other Ambulatory Visit: Payer: Self-pay

## 2019-08-18 ENCOUNTER — Emergency Department: Payer: Medicare Other

## 2019-08-18 DIAGNOSIS — Y9389 Activity, other specified: Secondary | ICD-10-CM | POA: Insufficient documentation

## 2019-08-18 DIAGNOSIS — I509 Heart failure, unspecified: Secondary | ICD-10-CM | POA: Insufficient documentation

## 2019-08-18 DIAGNOSIS — Y999 Unspecified external cause status: Secondary | ICD-10-CM | POA: Diagnosis not present

## 2019-08-18 DIAGNOSIS — Z79899 Other long term (current) drug therapy: Secondary | ICD-10-CM | POA: Insufficient documentation

## 2019-08-18 DIAGNOSIS — M79621 Pain in right upper arm: Secondary | ICD-10-CM | POA: Diagnosis not present

## 2019-08-18 DIAGNOSIS — W19XXXA Unspecified fall, initial encounter: Secondary | ICD-10-CM | POA: Diagnosis not present

## 2019-08-18 DIAGNOSIS — I110 Hypertensive heart disease with heart failure: Secondary | ICD-10-CM | POA: Diagnosis not present

## 2019-08-18 DIAGNOSIS — F0390 Unspecified dementia without behavioral disturbance: Secondary | ICD-10-CM | POA: Insufficient documentation

## 2019-08-18 DIAGNOSIS — M25552 Pain in left hip: Secondary | ICD-10-CM | POA: Insufficient documentation

## 2019-08-18 DIAGNOSIS — Y92199 Unspecified place in other specified residential institution as the place of occurrence of the external cause: Secondary | ICD-10-CM | POA: Diagnosis not present

## 2019-08-18 DIAGNOSIS — M79642 Pain in left hand: Secondary | ICD-10-CM | POA: Insufficient documentation

## 2019-08-18 NOTE — Discharge Instructions (Addendum)
Follow-up with your regular doctor as needed.  Return emergency department if any worsening symptoms.

## 2019-08-18 NOTE — ED Provider Notes (Signed)
[facility] Emergency Department Provider Note  ____________________________________________   First MD Initiated Contact with Patient 08/18/19 1655     (approximate)  I have reviewed the triage vital signs and the nursing notes.   HISTORY  Chief Complaint Fall    HPI Tamara Stevenson is a 79 y.o. female presents emergency department via EMS from [facility] assisted living.  She had an unwitnessed fall today.  Patient has baseline dementia.  Staff at the facility were unsure if she hit her head.  She has complained about her left hand and right arm.  She is a poor historian.  Daughter does not have much more information than we got from EMS.  Patient does have a DNR    Past Medical History:  Diagnosis Date  . 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 CHF (congestive heart failure) (HCC)   . Coronary artery disease   . Hyperlipidemia   . Hypertension   . Stroke [facility])     Patient Active Problem List   Diagnosis Date Noted  . Sepsis (HCC) 08/27/2018    Past Surgical History:  Procedure Laterality Date  . CARDIAC CATHETERIZATION  01/27/2011    Prior to Admission medications   Medication Sig Start Date End Date Taking? Authorizing Provider  acetaminophen (TYLENOL) 325 MG tablet Take 2 tablets (650 mg total) by mouth every 6 (six) hours as needed for mild pain (or Fever >/= 101). 08/30/18   Gouru, Deanna Artis, MD  albuterol (PROVENTIL HFA;VENTOLIN HFA) 108 (90 Base) MCG/ACT inhaler Inhale 2 puffs into the lungs every 6 (six) hours as needed for wheezing or shortness of breath. 08/30/18   Ramonita Lab, MD  alendronate (FOSAMAX) 70 MG tablet Take 70 mg by mouth every Sunday.  07/03/18   [provider]  Ascorbic Acid (VITAMIN C) 1000 MG tablet Take 1,000 mg by mouth daily.    [provider]  aspirin EC 81 MG tablet Take 81 mg by mouth daily.    [provider]  atorvastatin (LIPITOR) 40 MG tablet Take 40 mg by mouth daily.   06/05/18   [provider]  cefdinir (OMNICEF) 300 MG capsule Take 1 capsule (300 mg total) by mouth 2 (two) times daily. 08/30/18   Ramonita Lab, MD  Cholecalciferol (VITAMIN D3) 50 MCG (2000 UT) capsule Take 2,000 Units by mouth daily.    [provider]  memantine (NAMENDA) 10 MG tablet Take 10 mg by mouth 2 (two) times daily.  07/05/18   [provider]  metoprolol tartrate (LOPRESSOR) 50 MG tablet Take 0.5 tablets (25 mg total) by mouth 2 (two) times daily. 08/30/18   Ramonita Lab, MD  Multiple Vitamin (MULTI VITAMIN DAILY) TABS Take 1 tablet by mouth daily.    [provider]  rivastigmine (EXELON) 3 MG capsule Take 3 mg by mouth 2 (two) times daily. 07/05/18   [provider]  sertraline (ZOLOFT) 50 MG tablet Take 75 mg by mouth daily.  06/05/18   [provider]  traZODone (DESYREL) 50 MG tablet Take 50 mg by mouth at bedtime.    [provider]    Allergies Patient has no known allergies.  No family history on file.  Social History Social History   Tobacco Use  . Smoking status: Never Smoker  . Smokeless tobacco: Never Used  Substance Use Topics  . Alcohol use: Never  . Drug use: Never    Review of Systems  Constitutional: No fever/chills Eyes:  No visual changes. ENT: No sore throat. Respiratory: Denies cough Cardiovascular: Denies chest pain Gastrointestinal: Denies abdominal pain Genitourinary: Negative for dysuria. Musculoskeletal: Negative for back pain.  Positive for left hand and right arm pain Skin: Negative for rash. Psychiatric: no mood changes,     ____________________________________________   PHYSICAL EXAM:  VITAL SIGNS: ED Triage Vitals  Enc Vitals Group     BP 08/18/19 1648 (!) 150/60     Pulse Rate 08/18/19 1648 (!) 58     Resp 08/18/19 1648 18     Temp 08/18/19 1648 97.8 F (36.6 C)     Temp Source 08/18/19 1648 Axillary     SpO2 08/18/19 1648 99 %     Weight 08/18/19 1656 119 lb  0.8 oz (54 kg)     Height 08/18/19 1656 5\' 1"  (1.549 m)     Head Circumference --      Peak Flow --      Pain Score 08/18/19 1656 0     Pain Loc --      Pain Edu? --      Excl. in [place]? --     Constitutional: Alert and oriented. Well appearing and in no acute distress. Eyes: Conjunctivae are normal.  Head: Atraumatic. Nose: No congestion/rhinnorhea. Mouth/Throat: Mucous membranes are moist.   Neck:  supple no lymphadenopathy noted Cardiovascular: Normal rate, regular rhythm. Heart sounds are normal Respiratory: Normal respiratory effort.  No retractions, lungs c t a  Abd: soft nontender bs normal all 4 quad GU: deferred Musculoskeletal: Arthritis noted in the left hand, area is tender to palpation, right humerus is tender to palpation, left hip is minimally tender, neurovascular is intact  neurologic:  Normal speech and language.  Patient is at baseline dementia Skin:  Skin is warm, dry and intact. No rash noted. Psychiatric: Mood and affect are normal. Speech and behavior are normal.  ____________________________________________   LABS (all labs ordered are listed, but only abnormal results are displayed)  Labs Reviewed - No data to display ____________________________________________   ____________________________________________  RADIOLOGY  CT the head and C-spine are both negative X-ray of the right humerus and left hand are negative X-ray of the left hip is negative  ____________________________________________   PROCEDURES  Procedure(s) performed: No  Procedures    ____________________________________________   INITIAL IMPRESSION / ASSESSMENT AND PLAN / ED COURSE  Pertinent labs & imaging results that were available during my care of the patient were reviewed by me and considered in my medical decision making (see chart for details).   Patient is an 79 year old female presents emergency department via EMS from [facility] assisted living.  Patient had  an unwitnessed fall.  Unsure if she hit her head.  Patient is a poor historian.  Physical exam patient really does not have any complaints or tenderness.  Minimal tenderness of the right humerus and left hand along with the left hip.  Due to the unwitnessed fall we will do a CT of her head and C-spine, x-ray of the right humerus, left hand and left hip.  DDx: Subdural hematoma, SAH, C-spine fracture, humerus fracture, left hand fracture, hip fracture  CT of the head and C-spine are both negative, x-rays of the right humerus, left hand, and left hip are all negative  The daughter is in the room with the patient.  She has no more information that I have from EMS.  She states that she feels that her mother is fine if she did not have a  head injury.  Explained to her that all of the imaging results are negative.  I feel that she is stable to return back to the facility.  The daughter will be taking her by private vehicle.  She was discharged in stable condition.    Tamara Stevenson was evaluated in Emergency Department on 08/18/2019 for the symptoms described in the history of present illness. She was evaluated in the context of the global COVID-19 pandemic, which necessitated consideration that the patient might be at risk for infection with the SARS-CoV-2 virus that causes COVID-19. Institutional protocols and algorithms that pertain to the evaluation of patients at risk for COVID-19 are in a state of rapid change based on information released by regulatory bodies including the CDC and federal and state organizations. These policies and algorithms were followed during the patient's care in the ED.   As part of my medical decision making, I reviewed the following data within the electronic MEDICAL RECORD NUMBER History obtained from family, Nursing notes reviewed and incorporated, Old chart reviewed, Radiograph reviewed , Notes from prior ED visits and [place] Controlled Substance  Database  ____________________________________________   FINAL CLINICAL IMPRESSION(S) / ED DIAGNOSES  Final diagnoses:  Fall, initial encounter      NEW MEDICATIONS STARTED DURING THIS VISIT:  New Prescriptions   No medications on file     Note:  This document was prepared using Dragon voice recognition software and may include unintentional dictation errors.    Faythe Ghee, PA-C 08/18/19 1840    Emily Filbert, MD 08/18/19 [phone]

## 2019-08-18 NOTE — ED Notes (Signed)
Discharge paperwork and DNR given to daughter. Daughter at bedside and denies further questions concerns at this time. Daughter to sign for d/c.  Daughter states she will discuss discharge information with [facility] and will take pt back to [facility].

## 2019-08-18 NOTE — ED Triage Notes (Signed)
Pt to ED via [facility] from [facility] assisted living for chief complaint of unwitnessed fall. Ems reports pt baseline dementia. Reports unknown if pt hit her head. No blood thinner use reported.  Ems reports injury to left hand. Pt denies pain/injury at this time.  Pt oriented to person only, disoriented x3. Baseline.

## 2019-11-17 DEATH — deceased
